# Patient Record
Sex: Female | Born: 1970 | Race: Black or African American | Hispanic: No | Marital: Married | Smoking: Never smoker
Health system: Southern US, Community
[De-identification: ages and names within clinical notes are randomized; demographics above are authoritative.]

## PROBLEM LIST (undated history)

## (undated) DIAGNOSIS — Z789 Other specified health status: Secondary | ICD-10-CM

## (undated) HISTORY — PX: NO PAST SURGERIES: SHX2092

---

## 2000-08-24 ENCOUNTER — Other Ambulatory Visit: Admission: RE | Admit: 2000-08-24 | Discharge: 2000-08-24 | Payer: Self-pay | Admitting: Gynecology

## 2000-10-07 ENCOUNTER — Encounter: Payer: Self-pay | Admitting: Gynecology

## 2000-10-07 ENCOUNTER — Inpatient Hospital Stay (HOSPITAL_COMMUNITY): Admission: AD | Admit: 2000-10-07 | Discharge: 2000-10-07 | Payer: Self-pay | Admitting: Gynecology

## 2001-03-01 ENCOUNTER — Inpatient Hospital Stay (HOSPITAL_COMMUNITY): Admission: AD | Admit: 2001-03-01 | Discharge: 2001-03-03 | Payer: Self-pay | Admitting: Gynecology

## 2001-03-13 ENCOUNTER — Encounter: Admission: RE | Admit: 2001-03-13 | Discharge: 2001-04-12 | Payer: Self-pay | Admitting: Gynecology

## 2001-04-08 ENCOUNTER — Other Ambulatory Visit: Admission: RE | Admit: 2001-04-08 | Discharge: 2001-04-08 | Payer: Self-pay | Admitting: Gynecology

## 2001-04-13 ENCOUNTER — Encounter: Admission: RE | Admit: 2001-04-13 | Discharge: 2001-05-13 | Payer: Self-pay | Admitting: Gynecology

## 2001-05-14 ENCOUNTER — Encounter: Admission: RE | Admit: 2001-05-14 | Discharge: 2001-06-13 | Payer: Self-pay | Admitting: Gynecology

## 2001-07-14 ENCOUNTER — Encounter: Admission: RE | Admit: 2001-07-14 | Discharge: 2001-08-13 | Payer: Self-pay | Admitting: Gynecology

## 2001-09-13 ENCOUNTER — Encounter: Admission: RE | Admit: 2001-09-13 | Discharge: 2001-10-13 | Payer: Self-pay | Admitting: Gynecology

## 2001-10-14 ENCOUNTER — Encounter: Admission: RE | Admit: 2001-10-14 | Discharge: 2001-11-13 | Payer: Self-pay | Admitting: Gynecology

## 2001-12-12 ENCOUNTER — Encounter: Admission: RE | Admit: 2001-12-12 | Discharge: 2002-01-11 | Payer: Self-pay | Admitting: Gynecology

## 2002-02-11 ENCOUNTER — Encounter: Admission: RE | Admit: 2002-02-11 | Discharge: 2002-03-13 | Payer: Self-pay | Admitting: Gynecology

## 2004-01-20 ENCOUNTER — Other Ambulatory Visit: Admission: RE | Admit: 2004-01-20 | Discharge: 2004-01-20 | Payer: Self-pay | Admitting: Obstetrics and Gynecology

## 2004-04-04 ENCOUNTER — Encounter: Admission: RE | Admit: 2004-04-04 | Discharge: 2004-04-04 | Payer: Self-pay | Admitting: Family Medicine

## 2006-01-16 ENCOUNTER — Other Ambulatory Visit: Admission: RE | Admit: 2006-01-16 | Discharge: 2006-01-16 | Payer: Self-pay | Admitting: Obstetrics and Gynecology

## 2006-07-21 ENCOUNTER — Inpatient Hospital Stay (HOSPITAL_COMMUNITY): Admission: AD | Admit: 2006-07-21 | Discharge: 2006-07-23 | Payer: Self-pay | Admitting: Obstetrics and Gynecology

## 2010-12-29 ENCOUNTER — Other Ambulatory Visit: Payer: Self-pay | Admitting: Family Medicine

## 2010-12-29 ENCOUNTER — Other Ambulatory Visit (HOSPITAL_COMMUNITY)
Admission: RE | Admit: 2010-12-29 | Discharge: 2010-12-29 | Disposition: A | Discharge status: Home / Self Care | Payer: BC Managed Care – PPO | Source: Ambulatory Visit | Attending: Internal Medicine | Admitting: Internal Medicine

## 2010-12-29 DIAGNOSIS — Z01419 Encounter for gynecological examination (general) (routine) without abnormal findings: Secondary | ICD-10-CM | POA: Insufficient documentation

## 2010-12-29 DIAGNOSIS — N6459 Other signs and symptoms in breast: Secondary | ICD-10-CM

## 2011-01-03 ENCOUNTER — Ambulatory Visit
Admission: RE | Admit: 2011-01-03 | Discharge: 2011-01-03 | Disposition: A | Discharge status: Home / Self Care | Payer: BC Managed Care – PPO | Source: Ambulatory Visit | Attending: Family Medicine | Admitting: Family Medicine

## 2011-01-03 ENCOUNTER — Other Ambulatory Visit: Payer: Self-pay | Admitting: Family Medicine

## 2011-01-03 DIAGNOSIS — N6459 Other signs and symptoms in breast: Secondary | ICD-10-CM

## 2011-09-27 ENCOUNTER — Other Ambulatory Visit: Payer: Self-pay | Admitting: Family Medicine

## 2011-09-27 DIAGNOSIS — N920 Excessive and frequent menstruation with regular cycle: Secondary | ICD-10-CM

## 2011-09-29 ENCOUNTER — Ambulatory Visit
Admission: RE | Admit: 2011-09-29 | Discharge: 2011-09-29 | Disposition: A | Discharge status: Home / Self Care | Payer: BC Managed Care – PPO | Source: Ambulatory Visit | Attending: Family Medicine | Admitting: Family Medicine

## 2011-09-29 DIAGNOSIS — N920 Excessive and frequent menstruation with regular cycle: Secondary | ICD-10-CM

## 2011-10-02 ENCOUNTER — Other Ambulatory Visit: Payer: BC Managed Care – PPO

## 2011-10-04 ENCOUNTER — Other Ambulatory Visit: Payer: BC Managed Care – PPO

## 2011-12-07 ENCOUNTER — Other Ambulatory Visit (HOSPITAL_COMMUNITY)
Admission: RE | Admit: 2011-12-07 | Discharge: 2011-12-07 | Disposition: A | Discharge status: Home / Self Care | Payer: BC Managed Care – PPO | Source: Ambulatory Visit | Attending: Obstetrics and Gynecology | Admitting: Obstetrics and Gynecology

## 2011-12-07 ENCOUNTER — Other Ambulatory Visit: Payer: Self-pay | Admitting: Obstetrics and Gynecology

## 2011-12-07 DIAGNOSIS — Z01419 Encounter for gynecological examination (general) (routine) without abnormal findings: Secondary | ICD-10-CM | POA: Insufficient documentation

## 2012-01-25 ENCOUNTER — Other Ambulatory Visit: Payer: Self-pay | Admitting: Family Medicine

## 2012-03-20 ENCOUNTER — Other Ambulatory Visit: Payer: Self-pay | Admitting: Family Medicine

## 2012-03-20 DIAGNOSIS — Z1231 Encounter for screening mammogram for malignant neoplasm of breast: Secondary | ICD-10-CM

## 2012-04-16 ENCOUNTER — Ambulatory Visit
Admission: RE | Admit: 2012-04-16 | Discharge: 2012-04-16 | Disposition: A | Discharge status: Home / Self Care | Payer: BC Managed Care – PPO | Source: Ambulatory Visit | Attending: Family Medicine | Admitting: Family Medicine

## 2012-04-16 DIAGNOSIS — Z1231 Encounter for screening mammogram for malignant neoplasm of breast: Secondary | ICD-10-CM

## 2013-03-31 ENCOUNTER — Other Ambulatory Visit: Payer: Self-pay

## 2013-03-31 DIAGNOSIS — Z1231 Encounter for screening mammogram for malignant neoplasm of breast: Secondary | ICD-10-CM

## 2013-04-22 ENCOUNTER — Ambulatory Visit: Payer: BC Managed Care – PPO

## 2013-07-22 ENCOUNTER — Ambulatory Visit
Admission: RE | Admit: 2013-07-22 | Discharge: 2013-07-22 | Disposition: A | Discharge status: Home / Self Care | Payer: BC Managed Care – PPO | Source: Ambulatory Visit

## 2013-07-22 ENCOUNTER — Ambulatory Visit: Payer: BC Managed Care – PPO

## 2013-07-22 DIAGNOSIS — Z1231 Encounter for screening mammogram for malignant neoplasm of breast: Secondary | ICD-10-CM

## 2013-10-03 ENCOUNTER — Other Ambulatory Visit: Payer: Self-pay

## 2013-10-03 ENCOUNTER — Other Ambulatory Visit (HOSPITAL_COMMUNITY)
Admission: RE | Admit: 2013-10-03 | Discharge: 2013-10-03 | Disposition: A | Discharge status: Home / Self Care | Payer: BC Managed Care – PPO | Source: Ambulatory Visit | Attending: Family Medicine | Admitting: Family Medicine

## 2013-10-03 DIAGNOSIS — Z124 Encounter for screening for malignant neoplasm of cervix: Secondary | ICD-10-CM | POA: Insufficient documentation

## 2014-08-25 ENCOUNTER — Other Ambulatory Visit: Payer: Self-pay | Admitting: Internal Medicine

## 2014-08-25 DIAGNOSIS — E049 Nontoxic goiter, unspecified: Secondary | ICD-10-CM

## 2014-09-02 ENCOUNTER — Ambulatory Visit
Admission: RE | Admit: 2014-09-02 | Discharge: 2014-09-02 | Disposition: A | Discharge status: Home / Self Care | Payer: BC Managed Care – PPO | Source: Ambulatory Visit | Attending: Internal Medicine | Admitting: Internal Medicine

## 2014-09-02 DIAGNOSIS — E049 Nontoxic goiter, unspecified: Secondary | ICD-10-CM

## 2014-10-15 ENCOUNTER — Other Ambulatory Visit: Payer: Self-pay | Admitting: Family Medicine

## 2014-10-15 DIAGNOSIS — E042 Nontoxic multinodular goiter: Secondary | ICD-10-CM

## 2014-10-30 ENCOUNTER — Ambulatory Visit
Admission: RE | Admit: 2014-10-30 | Discharge: 2014-10-30 | Disposition: A | Discharge status: Home / Self Care | Payer: BC Managed Care – PPO | Source: Ambulatory Visit | Attending: Family Medicine | Admitting: Family Medicine

## 2014-10-30 DIAGNOSIS — E042 Nontoxic multinodular goiter: Secondary | ICD-10-CM

## 2014-12-28 ENCOUNTER — Other Ambulatory Visit: Payer: Self-pay

## 2014-12-28 DIAGNOSIS — Z1231 Encounter for screening mammogram for malignant neoplasm of breast: Secondary | ICD-10-CM

## 2015-01-14 ENCOUNTER — Ambulatory Visit: Payer: BC Managed Care – PPO

## 2015-12-29 IMAGING — US US SOFT TISSUE HEAD/NECK
1 series · 13 of 25 positions shown · non-contrast
Comparison: Prior thyroid ultrasound 09/02/2014

CLINICAL DATA: 43-year-old female with multinodular goiter

EXAM:
THYROID ULTRASOUND
TECHNIQUE: Ultrasound examination of the thyroid gland and adjacent soft
tissues was performed.

[Series 1: us soft tissue head/neck · 0.05mm/px · 13 of 60 slices shown]
[im 1/60]
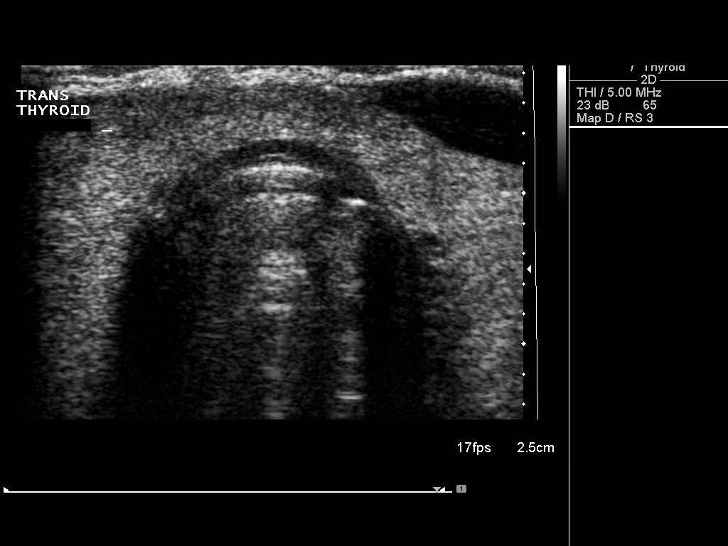
[im 5/60]
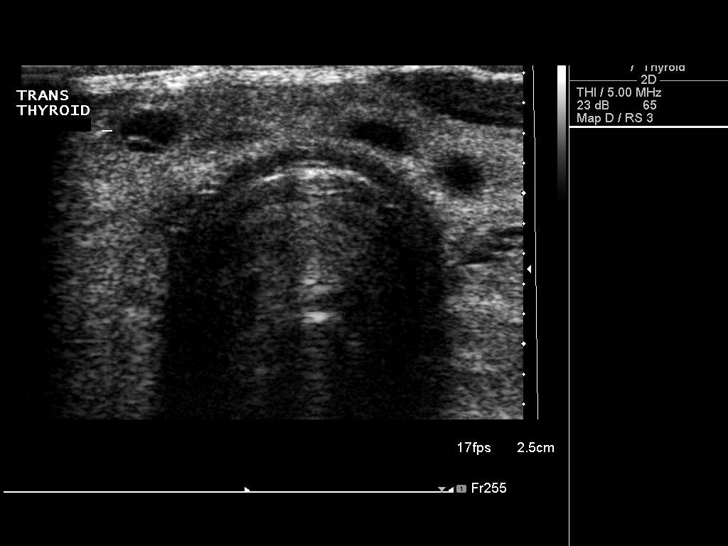
[im 10/60]
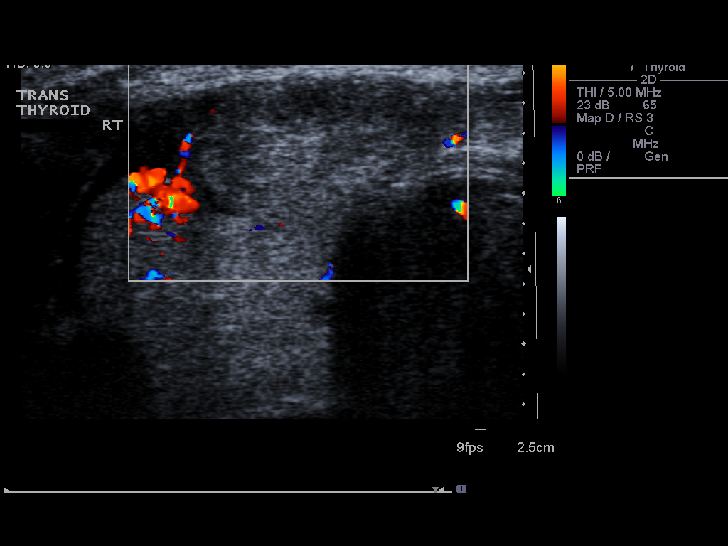
[im 15/60]
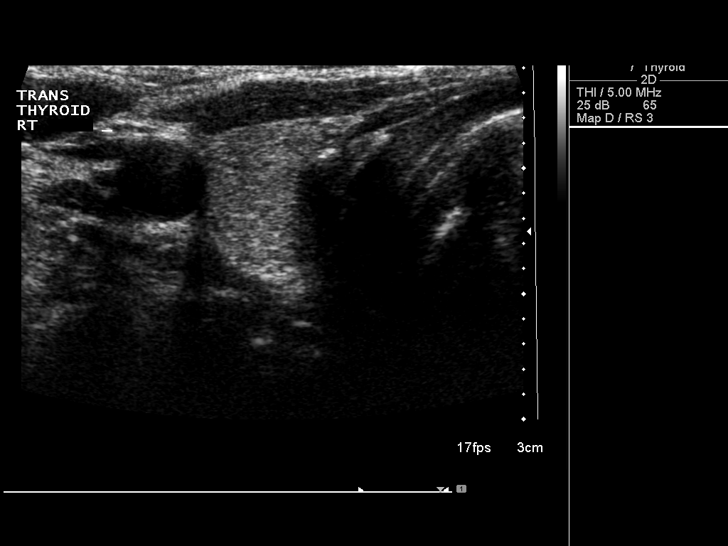
[im 20/60]
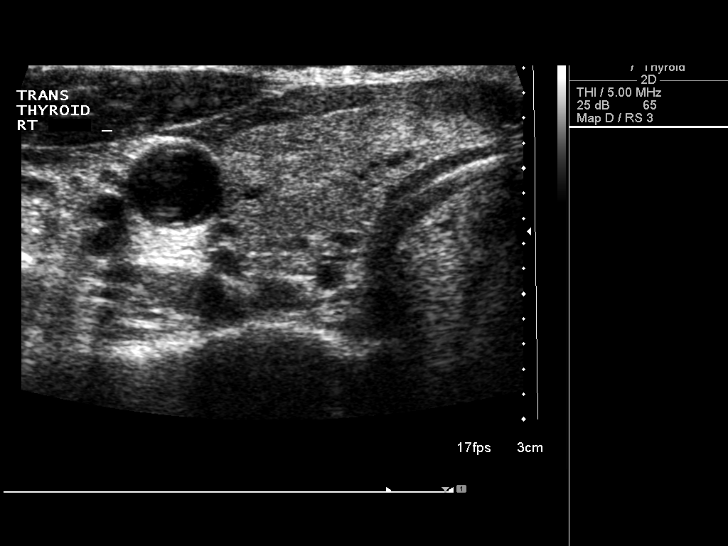
[im 25/60]
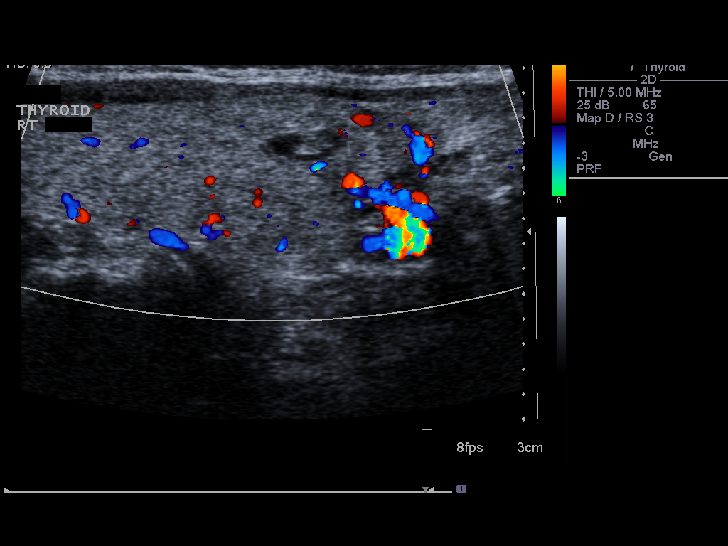
[im 30/60]
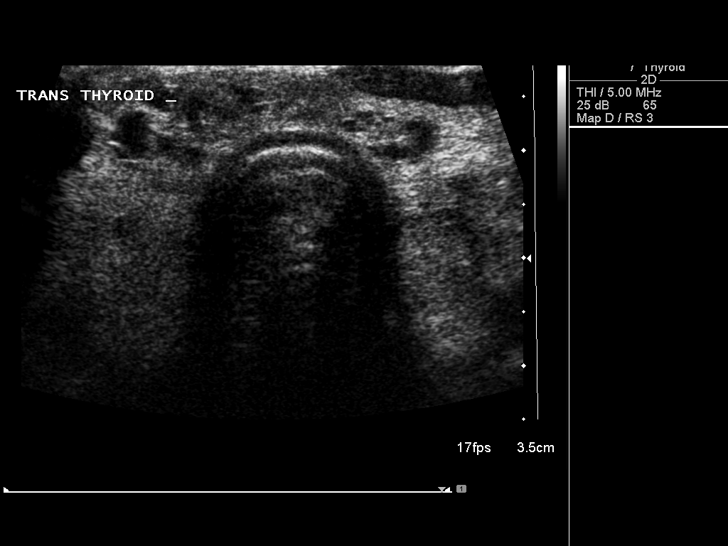
[im 35/60]
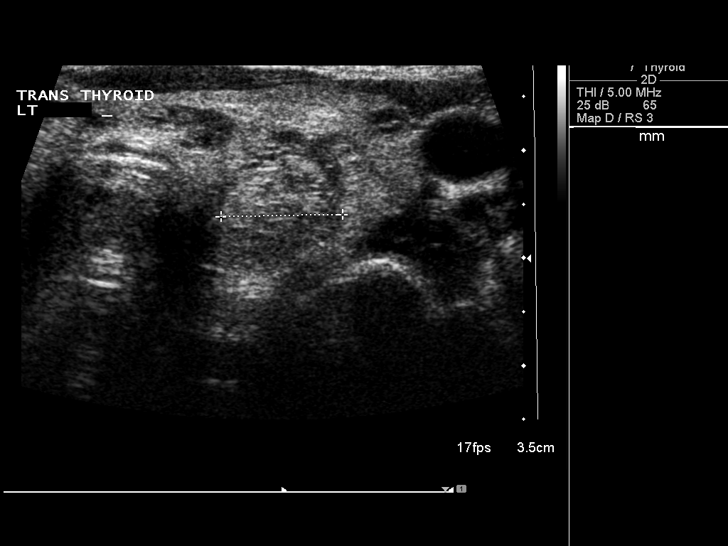
[im 40/60]
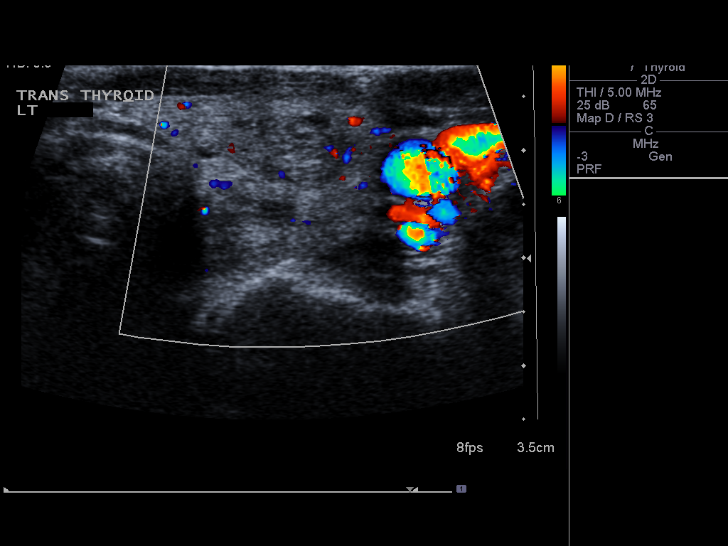
[im 45/60]
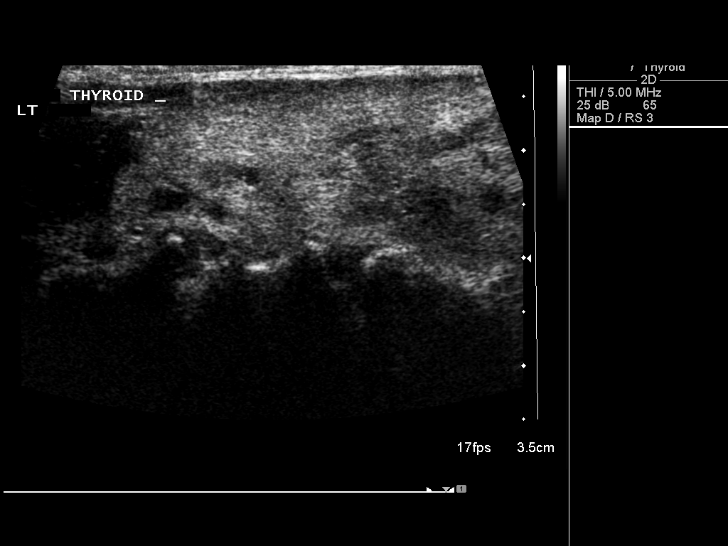
[im 50/60]
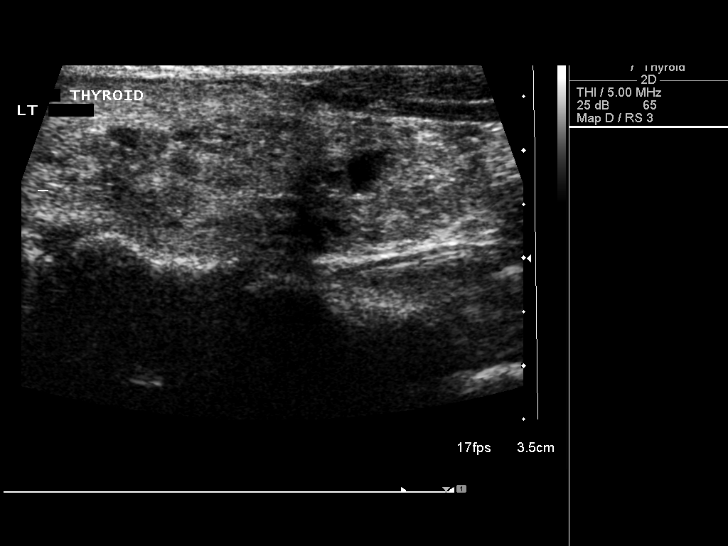
[im 55/60]
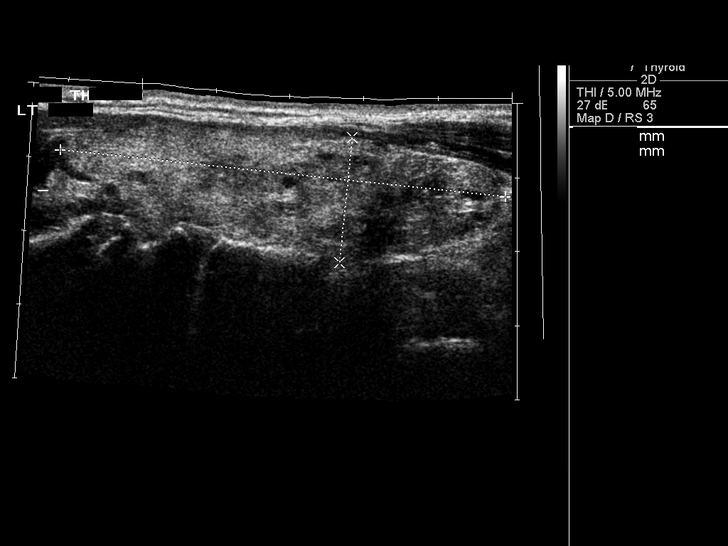
[im 60/60]
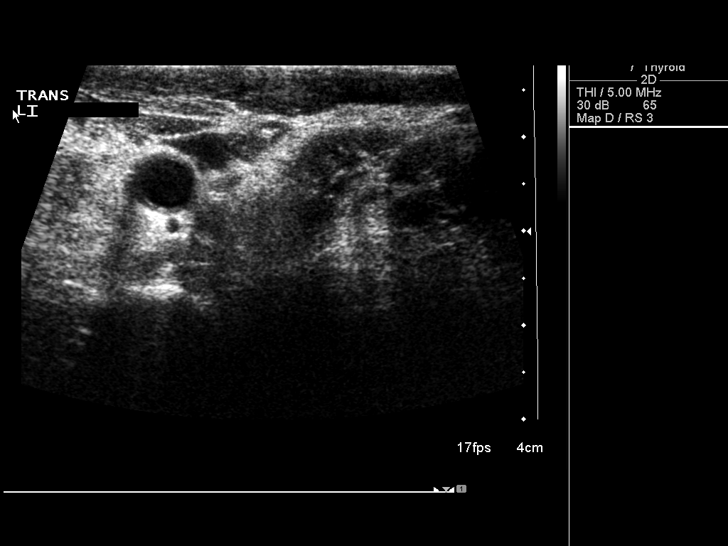

[13 of 25 positions shown; findings below may reference images not displayed]

FINDINGS: Right thyroid lobe

Measurements: 5.3 x 1.5 x 1.5 cm. Heterogeneous gland. Discrete
nodules as follows

1. Dominant solid isoechoic nodule in the medial mid gland abutting
the isthmus measures 13 x 9 x 15 mm which has decreased in size
compared to 17 x 10 x 21 mm previously.
2. Small mixed cystic and solid nodule in the lower pole measures up
to 6 mm which is unchanged compared to prior
Left thyroid lobe

Measurements: 6.0 x 1.7 x 1.8 cm. Heterogeneous gland with multiple
tiny cystic nodule scattered throughout similar compared to prior.
The solitary predominantly solid nodule in the mid gland remains
unchanged at 12 x 10 x 11 mm. The previously questioned subtle
nodule versus cluster of cysts in the lower pole appears less like a
discrete nodule today and more like a region of heterogeneity with
small internal cysts. No definite left lower pole nodule.

Isthmus

Thickness: 0.6 cm.  No nodules visualized.

Lymphadenopathy

None visualized.
IMPRESSION: 1. Interval involution of the dominant nodule in the right aspect of
the isthmus. Involution is consistent with benignity.
2. The previously noted questionable nodule versus cluster of small
cysts in the lower pole of the left thyroid lobe appears less
nodular like on today's evaluation and is strongly favored to
represent a pseudo nodule.
3. The remaining nodules demonstrate no interval change.

## 2016-03-09 ENCOUNTER — Other Ambulatory Visit: Payer: Self-pay | Admitting: Family Medicine

## 2016-03-09 DIAGNOSIS — Z1231 Encounter for screening mammogram for malignant neoplasm of breast: Secondary | ICD-10-CM

## 2016-03-15 ENCOUNTER — Ambulatory Visit
Admission: RE | Admit: 2016-03-15 | Discharge: 2016-03-15 | Disposition: A | Discharge status: Home / Self Care | Payer: BC Managed Care – PPO | Source: Ambulatory Visit | Attending: Family Medicine | Admitting: Family Medicine

## 2016-03-15 DIAGNOSIS — Z1231 Encounter for screening mammogram for malignant neoplasm of breast: Secondary | ICD-10-CM

## 2017-02-26 ENCOUNTER — Other Ambulatory Visit: Payer: Self-pay | Admitting: Family Medicine

## 2017-02-26 DIAGNOSIS — Z1231 Encounter for screening mammogram for malignant neoplasm of breast: Secondary | ICD-10-CM

## 2017-04-03 ENCOUNTER — Other Ambulatory Visit: Payer: Self-pay | Admitting: Family Medicine

## 2017-04-03 DIAGNOSIS — Z1231 Encounter for screening mammogram for malignant neoplasm of breast: Secondary | ICD-10-CM

## 2017-06-15 ENCOUNTER — Other Ambulatory Visit: Payer: Self-pay | Admitting: Family Medicine

## 2017-06-15 DIAGNOSIS — E010 Iodine-deficiency related diffuse (endemic) goiter: Secondary | ICD-10-CM

## 2017-06-19 ENCOUNTER — Other Ambulatory Visit: Payer: BC Managed Care – PPO

## 2017-06-25 ENCOUNTER — Ambulatory Visit
Admission: RE | Admit: 2017-06-25 | Discharge: 2017-06-25 | Disposition: A | Discharge status: Home / Self Care | Payer: BC Managed Care – PPO | Source: Ambulatory Visit | Attending: Family Medicine | Admitting: Family Medicine

## 2017-06-25 DIAGNOSIS — E010 Iodine-deficiency related diffuse (endemic) goiter: Secondary | ICD-10-CM

## 2018-01-30 ENCOUNTER — Other Ambulatory Visit: Payer: Self-pay | Admitting: Family Medicine

## 2018-01-30 DIAGNOSIS — Z1231 Encounter for screening mammogram for malignant neoplasm of breast: Secondary | ICD-10-CM

## 2018-02-20 ENCOUNTER — Ambulatory Visit
Admission: RE | Admit: 2018-02-20 | Discharge: 2018-02-20 | Disposition: A | Discharge status: Home / Self Care | Payer: BC Managed Care – PPO | Source: Ambulatory Visit | Attending: Family Medicine | Admitting: Family Medicine

## 2018-02-20 DIAGNOSIS — Z1231 Encounter for screening mammogram for malignant neoplasm of breast: Secondary | ICD-10-CM

## 2018-06-21 ENCOUNTER — Other Ambulatory Visit: Payer: Self-pay | Admitting: Family Medicine

## 2018-06-21 DIAGNOSIS — E010 Iodine-deficiency related diffuse (endemic) goiter: Secondary | ICD-10-CM

## 2018-07-08 ENCOUNTER — Other Ambulatory Visit: Payer: BC Managed Care – PPO

## 2018-07-12 ENCOUNTER — Ambulatory Visit
Admission: RE | Admit: 2018-07-12 | Discharge: 2018-07-12 | Disposition: A | Discharge status: Home / Self Care | Payer: BC Managed Care – PPO | Source: Ambulatory Visit | Attending: Family Medicine | Admitting: Family Medicine

## 2018-07-12 DIAGNOSIS — E010 Iodine-deficiency related diffuse (endemic) goiter: Secondary | ICD-10-CM

## 2018-08-07 ENCOUNTER — Other Ambulatory Visit: Payer: Self-pay | Admitting: Obstetrics and Gynecology

## 2018-08-07 ENCOUNTER — Other Ambulatory Visit (HOSPITAL_COMMUNITY)
Admission: RE | Admit: 2018-08-07 | Discharge: 2018-08-07 | Disposition: A | Discharge status: Home / Self Care | Payer: BC Managed Care – PPO | Source: Ambulatory Visit | Attending: Obstetrics and Gynecology | Admitting: Obstetrics and Gynecology

## 2018-08-07 DIAGNOSIS — Z01411 Encounter for gynecological examination (general) (routine) with abnormal findings: Secondary | ICD-10-CM | POA: Diagnosis not present

## 2018-08-12 LAB — CYTOLOGY - PAP
DIAGNOSIS: NEGATIVE
HPV (WINDOPATH): NOT DETECTED

## 2018-08-19 ENCOUNTER — Other Ambulatory Visit: Payer: Self-pay | Admitting: Family Medicine

## 2018-08-19 DIAGNOSIS — E042 Nontoxic multinodular goiter: Secondary | ICD-10-CM

## 2018-09-10 ENCOUNTER — Other Ambulatory Visit (HOSPITAL_COMMUNITY)
Admission: RE | Admit: 2018-09-10 | Discharge: 2018-09-10 | Disposition: A | Discharge status: Home / Self Care | Payer: BC Managed Care – PPO | Source: Ambulatory Visit | Attending: Physician Assistant | Admitting: Physician Assistant

## 2018-09-10 ENCOUNTER — Ambulatory Visit
Admission: RE | Admit: 2018-09-10 | Discharge: 2018-09-10 | Disposition: A | Discharge status: Home / Self Care | Payer: BC Managed Care – PPO | Source: Ambulatory Visit | Attending: Family Medicine | Admitting: Family Medicine

## 2018-09-10 DIAGNOSIS — E041 Nontoxic single thyroid nodule: Secondary | ICD-10-CM | POA: Diagnosis present

## 2018-09-10 DIAGNOSIS — E042 Nontoxic multinodular goiter: Secondary | ICD-10-CM

## 2018-09-10 NOTE — Procedures (Signed)
PROCEDURE SUMMARY:  Using direct ultrasound guidance, 4 passes were made using 25 g needles into the nodule within the isthmus of the thyroid.   Ultrasound was used to confirm needle placements on all occasions.   EBL = trace  Specimens were sent to Pathology for analysis.  See procedure note under Imaging tab in Epic for full procedure details.  WENDY S BLAIR PA-C 09/10/2018 4:19 PM

## 2019-05-26 ENCOUNTER — Other Ambulatory Visit: Payer: Self-pay | Admitting: Family Medicine

## 2019-05-26 DIAGNOSIS — Z1231 Encounter for screening mammogram for malignant neoplasm of breast: Secondary | ICD-10-CM

## 2019-07-11 ENCOUNTER — Encounter (HOSPITAL_COMMUNITY): Payer: Self-pay

## 2019-07-11 ENCOUNTER — Ambulatory Visit (HOSPITAL_COMMUNITY)
Admission: EM | Admit: 2019-07-11 | Discharge: 2019-07-11 | Disposition: A | Discharge status: Home / Self Care | Payer: BC Managed Care – PPO | Attending: Emergency Medicine | Admitting: Emergency Medicine

## 2019-07-11 DIAGNOSIS — U071 COVID-19: Secondary | ICD-10-CM | POA: Diagnosis not present

## 2019-07-11 DIAGNOSIS — B349 Viral infection, unspecified: Secondary | ICD-10-CM

## 2019-07-11 NOTE — ED Triage Notes (Signed)
Pt states having mild cough, chills, headache and body aches x 1 days. Pt reports having diarrhea x 1 day, but she states it can be relate to her cycle which started 2 days ago.

## 2019-07-11 NOTE — ED Provider Notes (Addendum)
[place]    CSN: [id number] Arrival date & time: 07/11/19  6644      History   Chief Complaint Chief Complaint  Patient presents with  . Chills  . Generalized Body Aches  . Cough  . Diarrhea  . Leg Pain  . Headache    HPI Kimberly Clements is a 48 y.o. female.   Patient presents with a 1 day history of nonproductive cough, chills, headache, body aches, diarrhea.  She denies fever, sore throat, shortness of breath, abdominal pain, vomiting, rash, or other symptoms.  No treatments attempted at home.  No pertinent medical history.  The history is provided by the patient.    History reviewed. No pertinent past medical history.  There are no active problems to display for this patient.   History reviewed. No pertinent surgical history.  OB History   No obstetric history on file.      Home Medications    Prior to Admission medications   Medication Sig Start Date End Date Taking? Authorizing Provider  Ascorbic Acid (VITAMIN C) 1000 MG tablet Take 1,000 mg by mouth daily.   Yes [provider]  VITAMIN D PO Take by mouth.   Yes [provider]  Iron-FA-B Cmp-C-Biot-Probiotic (FUSION PLUS) CAPS Take 1 capsule by mouth daily. 03/03/19   [provider]    Family History History reviewed. No pertinent family history.  Social History Social History   Tobacco Use  . Smoking status: Never Smoker  . Smokeless tobacco: Never Used  Substance Use Topics  . Alcohol use: Not on file  . Drug use: Not on file     Allergies   Patient has no known allergies.   Review of Systems Review of Systems  Constitutional: Positive for chills. Negative for fever.  HENT: Negative for ear pain and sore throat.   Eyes: Negative for pain and visual disturbance.  Respiratory: Positive for cough. Negative for shortness of breath.   Cardiovascular: Negative for chest pain and palpitations.  Gastrointestinal: Positive for diarrhea. Negative for  abdominal pain, nausea and vomiting.  Genitourinary: Negative for dysuria and hematuria.  Musculoskeletal: Negative for arthralgias and back pain.  Skin: Negative for color change and rash.  Neurological: Positive for headaches. Negative for seizures and syncope.  All other systems reviewed and are negative.    Physical Exam Triage Vital Signs ED Triage Vitals  Enc Vitals Group     BP 07/11/19 1007 (!) 127/92     Pulse Rate 07/11/19 1007 (!) 112     Resp 07/11/19 1007 17     Temp 07/11/19 1007 98.9 F (37.2 C)     Temp Source 07/11/19 1007 Temporal     SpO2 07/11/19 1007 100 %     Weight --      Height --      Head Circumference --      Peak Flow --      Pain Score 07/11/19 1003 5     Pain Loc --      Pain Edu? --      Excl. in [place]? --    No data found.  Updated Vital Signs BP (!) 127/92 (BP Location: Left Arm)   Pulse (!) 112   Temp 98.9 F (37.2 C) (Temporal)   Resp 17   LMP 07/09/2019 (Exact Date)   SpO2 100%   Visual Acuity Right Eye Distance:   Left Eye Distance:   Bilateral Distance:    Right Eye Near:  Left Eye Near:    Bilateral Near:     Physical Exam Vitals signs and nursing note reviewed.  Constitutional:      General: She is not in acute distress.    Appearance: She is well-developed.  HENT:     Head: Normocephalic and atraumatic.     Right Ear: Tympanic membrane normal.     Left Ear: Tympanic membrane normal.     Nose: Nose normal.     Mouth/Throat:     Mouth: Mucous membranes are moist.     Pharynx: Oropharynx is clear.  Eyes:     Conjunctiva/sclera: Conjunctivae normal.  Neck:     Musculoskeletal: Neck supple.  Cardiovascular:     Rate and Rhythm: Normal rate and regular rhythm.     Heart sounds: No murmur.  Pulmonary:     Effort: Pulmonary effort is normal. No respiratory distress.     Breath sounds: Normal breath sounds.  Abdominal:     General: Bowel sounds are normal.     Palpations: Abdomen is soft.     Tenderness: There  is no abdominal tenderness. There is no guarding or rebound.  Skin:    General: Skin is warm and dry.     Findings: No rash.  Neurological:     General: No focal deficit present.     Mental Status: She is alert and oriented to person, place, and time.      UC Treatments / Results  Labs (all labs ordered are listed, but only abnormal results are displayed) Labs Reviewed  NOVEL CORONAVIRUS, NAA (HOSP ORDER, SEND-OUT TO REF LAB; TAT 18-24 HRS)    EKG   Radiology No results found.  Procedures Procedures (including critical care time)  Medications Ordered in UC Medications - No data to display  Initial Impression / Assessment and Plan / UC Course  I have reviewed the triage vital signs and the nursing notes.  Pertinent labs & imaging results that were available during my care of the patient were reviewed by me and considered in my medical decision making (see chart for details).    Viral illness.  Instructed patient to take Tylenol as needed for discomfort or fever.  COVID test performed here.  Instructed patient to self quarantine until the test result is back.  Instructed patient to go to the emergency department if she develops high fever, shortness of breath, severe diarrhea, or other concerning symptoms.  Patient agrees with plan of care.     Final Clinical Impressions(s) / UC Diagnoses   Final diagnoses:  Viral illness     Discharge Instructions     Your COVID test is pending.  You should self quarantine until your test result is back and is negative.    Go to the emergency department if you develop high fever, shortness of breath, severe diarrhea, or other concerning symptoms.       ED Prescriptions    None     PDMP not reviewed this encounter.   Mickie Bail, NP 07/11/19 1030    Mickie Bail, NP 07/11/19 1030

## 2019-07-11 NOTE — Discharge Instructions (Addendum)
Your COVID test is pending.  You should self quarantine until your test result is back and is negative.   ° °Go to the emergency department if you develop high fever, shortness of breath, severe diarrhea, or other concerning symptoms.   ° °

## 2019-07-14 ENCOUNTER — Ambulatory Visit: Payer: BC Managed Care – PPO

## 2019-07-14 LAB — NOVEL CORONAVIRUS, NAA (HOSP ORDER, SEND-OUT TO REF LAB; TAT 18-24 HRS): SARS-CoV-2, NAA: DETECTED — AB

## 2019-07-15 ENCOUNTER — Telehealth (HOSPITAL_COMMUNITY): Payer: Self-pay | Admitting: Emergency Medicine

## 2019-07-15 NOTE — Telephone Encounter (Signed)
Positive Covid. Pt contacted and made aware, she had no questions.

## 2019-08-05 ENCOUNTER — Ambulatory Visit
Admission: RE | Admit: 2019-08-05 | Discharge: 2019-08-05 | Disposition: A | Discharge status: Home / Self Care | Payer: BC Managed Care – PPO | Source: Ambulatory Visit | Attending: Family Medicine | Admitting: Family Medicine

## 2019-08-05 ENCOUNTER — Other Ambulatory Visit: Payer: Self-pay | Admitting: Family Medicine

## 2019-08-05 DIAGNOSIS — R05 Cough: Secondary | ICD-10-CM

## 2019-08-05 DIAGNOSIS — R053 Chronic cough: Secondary | ICD-10-CM

## 2019-11-01 ENCOUNTER — Ambulatory Visit: Payer: BC Managed Care – PPO | Attending: Internal Medicine

## 2019-11-01 DIAGNOSIS — Z23 Encounter for immunization: Secondary | ICD-10-CM

## 2019-11-01 NOTE — Progress Notes (Signed)
   Covid-19 Vaccination Clinic  Name:  Kimberly Clements    MRN: [id number] DOB: 08-02-1971  11/01/2019  [name] was observed post Covid-19 immunization for 15 minutes without incidence. She was provided with Vaccine Information Sheet and instruction to access the V-Safe system.   [name] was instructed to call 911 with any severe reactions post vaccine: Marland Kitchen Difficulty breathing  . Swelling of your face and throat  . A fast heartbeat  . A bad rash all over your body  . Dizziness and weakness    Immunizations Administered    Name Date Dose VIS Date Route   Pfizer COVID-19 Vaccine 11/01/2019 12:15 PM 0.3 mL 08/15/2019 Intramuscular   Manufacturer: [employer], [employer]   Lot: [id number]   NDC: [address]-4

## 2019-11-22 ENCOUNTER — Ambulatory Visit: Payer: BC Managed Care – PPO | Attending: Internal Medicine

## 2019-11-22 DIAGNOSIS — Z23 Encounter for immunization: Secondary | ICD-10-CM

## 2019-11-22 NOTE — Progress Notes (Signed)
   Covid-19 Vaccination Clinic  Name:  ZOII FLORER    MRN: [id number] DOB: [birth year]  11/22/2019  Ms. Redwine was observed post Covid-19 immunization for 15 minutes without incident. She was provided with Vaccine Information Sheet and instruction to access the V-Safe system.   Ms. Ortman was instructed to call 911 with any severe reactions post vaccine: Marland Kitchen Difficulty breathing  . Swelling of face and throat  . A fast heartbeat  . A bad rash all over body  . Dizziness and weakness   Immunizations Administered    Name Date Dose VIS Date Route   Pfizer COVID-19 Vaccine 11/22/2019 12:15 PM 0.3 mL 08/15/2019 Intramuscular   Manufacturer: [employer], [employer]   Lot: [id number]   NDC: [address]-5

## 2019-11-26 ENCOUNTER — Ambulatory Visit: Payer: BC Managed Care – PPO

## 2020-03-30 ENCOUNTER — Ambulatory Visit (HOSPITAL_COMMUNITY)
Admission: EM | Admit: 2020-03-30 | Discharge: 2020-03-30 | Disposition: A | Discharge status: Home / Self Care | Payer: BC Managed Care – PPO

## 2020-03-30 ENCOUNTER — Other Ambulatory Visit: Payer: Self-pay

## 2020-03-30 ENCOUNTER — Ambulatory Visit (HOSPITAL_COMMUNITY)
Admission: EM | Admit: 2020-03-30 | Discharge: 2020-03-30 | Disposition: A | Discharge status: Home / Self Care | Payer: BC Managed Care – PPO | Attending: Family Medicine | Admitting: Family Medicine

## 2020-03-30 ENCOUNTER — Encounter (HOSPITAL_COMMUNITY): Payer: Self-pay | Admitting: Emergency Medicine

## 2020-03-30 DIAGNOSIS — Z20822 Contact with and (suspected) exposure to covid-19: Secondary | ICD-10-CM | POA: Insufficient documentation

## 2020-03-30 LAB — SARS CORONAVIRUS 2 (TAT 6-24 HRS): SARS Coronavirus 2: NEGATIVE

## 2020-03-30 NOTE — ED Triage Notes (Signed)
COVID exposure, aymptomatic

## 2020-03-30 NOTE — ED Provider Notes (Signed)
  [facility]   [id number] 03/30/20 Arrival Time: 1119  ASSESSMENT & PLAN:  1. Exposure to COVID-19 virus      COVID-19 testing sent.    Follow-up Information    Farris Has, MD.   Specialty: Family Medicine Why: As needed. Contact information: [address] [place] [address] [phone]               Reviewed expectations re: course of current medical issues. Questions answered. Outlined signs and symptoms indicating need for more acute intervention. Understanding verbalized. After Visit Summary given.   SUBJECTIVE: History from: patient. Kimberly Clements is a 49 y.o. female who requests COVID-19 testing. Known COVID-19 contact: reports exposure. Recent travel: none. Denies: runny nose, congestion, fever, cough, sore throat, difficulty breathing and headache. Normal PO intake without n/v/d.    OBJECTIVE:  Vitals:   03/30/20 1245  BP: (!) 131/84  Pulse: 88  Resp: 16  Temp: 98.7 F (37.1 C)  TempSrc: Oral  SpO2: 99%    General appearance: alert; no distress Lungs: speaks full sentences without difficulty; unlabored Psychological: alert and cooperative; normal mood and affect  Labs:  Labs Reviewed  SARS CORONAVIRUS 2 (TAT 6-24 HRS)    No Known Allergies  History reviewed. No pertinent past medical history. Social History   Socioeconomic History  . Marital status: Married    Spouse name: Not on file  . Number of children: Not on file  . Years of education: Not on file  . Highest education level: Not on file  Occupational History  . Not on file  Tobacco Use  . Smoking status: Never Smoker  . Smokeless tobacco: Never Used  Substance and Sexual Activity  . Alcohol use: Not on file  . Drug use: Not on file  . Sexual activity: Not on file  Other Topics Concern  . Not on file  Social History Narrative  . Not on file   Social Determinants of Health   Financial Resource Strain:   . Difficulty of Paying  Living Expenses:   Food Insecurity:   . Worried About Programme researcher, broadcasting/film/video in the Last Year:   . Barista in the Last Year:   Transportation Needs:   . Freight forwarder (Medical):   Marland Kitchen Lack of Transportation (Non-Medical):   Physical Activity:   . Days of Exercise per Week:   . Minutes of Exercise per Session:   Stress:   . Feeling of Stress :   Social Connections:   . Frequency of Communication with Friends and Family:   . Frequency of Social Gatherings with Friends and Family:   . Attends Religious Services:   . Active Member of Clubs or Organizations:   . Attends Banker Meetings:   Marland Kitchen Marital Status:   Intimate Partner Violence:   . Fear of Current or Ex-Partner:   . Emotionally Abused:   Marland Kitchen Physically Abused:   . Sexually Abused:    No family history on file. History reviewed. No pertinent surgical history.   Mardella Layman, MD 03/30/20 [phone]

## 2020-03-30 NOTE — Discharge Instructions (Signed)
You have been tested for COVID-19 today. °If your test returns positive, you will receive a phone call from [facility] regarding your results. °Negative test results are not called. °Both positive and negative results area always visible on MyChart. °If you do not have a MyChart account, sign up instructions are provided in your discharge papers. °Please do not hesitate to contact us should you have questions or concerns. ° °

## 2021-05-19 ENCOUNTER — Other Ambulatory Visit: Payer: Self-pay | Admitting: Family Medicine

## 2021-05-19 DIAGNOSIS — Z1231 Encounter for screening mammogram for malignant neoplasm of breast: Secondary | ICD-10-CM

## 2021-05-23 ENCOUNTER — Other Ambulatory Visit: Payer: Self-pay

## 2021-05-23 ENCOUNTER — Ambulatory Visit
Admission: RE | Admit: 2021-05-23 | Discharge: 2021-05-23 | Disposition: A | Discharge status: Home / Self Care | Payer: BC Managed Care – PPO | Source: Ambulatory Visit | Attending: Family Medicine | Admitting: Family Medicine

## 2021-05-23 DIAGNOSIS — Z1231 Encounter for screening mammogram for malignant neoplasm of breast: Secondary | ICD-10-CM

## 2021-05-30 ENCOUNTER — Other Ambulatory Visit: Payer: Self-pay | Admitting: Family Medicine

## 2021-05-30 DIAGNOSIS — R928 Other abnormal and inconclusive findings on diagnostic imaging of breast: Secondary | ICD-10-CM

## 2021-06-15 ENCOUNTER — Other Ambulatory Visit: Payer: Self-pay

## 2021-06-15 ENCOUNTER — Ambulatory Visit
Admission: RE | Admit: 2021-06-15 | Discharge: 2021-06-15 | Disposition: A | Discharge status: Home / Self Care | Payer: BC Managed Care – PPO | Source: Ambulatory Visit | Attending: Family Medicine | Admitting: Family Medicine

## 2021-06-15 ENCOUNTER — Ambulatory Visit: Payer: BC Managed Care – PPO

## 2021-06-15 DIAGNOSIS — R928 Other abnormal and inconclusive findings on diagnostic imaging of breast: Secondary | ICD-10-CM

## 2021-11-30 ENCOUNTER — Other Ambulatory Visit: Payer: Self-pay

## 2021-11-30 ENCOUNTER — Emergency Department (HOSPITAL_BASED_OUTPATIENT_CLINIC_OR_DEPARTMENT_OTHER)
Admission: EM | Admit: 2021-11-30 | Discharge: 2021-12-01 | Disposition: A | Discharge status: Home / Self Care | Payer: BC Managed Care – PPO | Attending: Emergency Medicine | Admitting: Emergency Medicine

## 2021-11-30 ENCOUNTER — Encounter (HOSPITAL_BASED_OUTPATIENT_CLINIC_OR_DEPARTMENT_OTHER): Payer: Self-pay

## 2021-11-30 DIAGNOSIS — G500 Trigeminal neuralgia: Secondary | ICD-10-CM | POA: Insufficient documentation

## 2021-11-30 DIAGNOSIS — R519 Headache, unspecified: Secondary | ICD-10-CM | POA: Diagnosis present

## 2021-11-30 NOTE — ED Triage Notes (Signed)
Patient here POV from Home. ? ?Patient endorses Facial Pain for approximately 3-4 days that has been worsening since it began. Pain is in Upper and Lower Gums (Left Side) and radiates to Back Gums and Cheek. ? ?Somewhat Responsive to Tylenol but Pain always remains. Patient went to Dentist today and Scans were Negative. ? ?No Fevers. No Drainage. No N/V/D.  ? ?NAD Noted during Triage. A&Ox4. GCS 15. Ambulatory.  ?

## 2021-12-01 ENCOUNTER — Encounter: Payer: Self-pay | Admitting: Neurology

## 2021-12-01 MED ORDER — CARBAMAZEPINE ER 100 MG PO TB12: 100.0000 mg | ORAL_TABLET | Freq: Two times a day (BID) | ORAL | 0 refills | Status: DC

## 2021-12-01 NOTE — ED Provider Notes (Signed)
? ?[facility]  ?Provider Note ? ?CSN: [id number] ?Arrival date & time: 11/30/21 2051 ? ?History ?Chief Complaint  ?Patient presents with  ? Facial Pain  ? ? ?Kimberly Clements is a 51 y.o. female with no significant PMH reports 4 days of intermittent sharp L sided facial pain, started L left jaw but has spread to cheek and L ear. No nasal congestion, sinus pressure, fever, cough. No blurry vision or eye watering. No headaches. No similar symptoms in the past. She went to a dentist and had neg exam and xrays. Not felt to be a dental problem. Has woken her up from sleep at night. ? ? ?Home Medications ?Prior to Admission medications   ?Medication Sig Start Date End Date Taking? Authorizing Provider  ?carbamazepine (TEGRETOL-XR) 100 MG 12 hr tablet Take 1 tablet (100 mg total) by mouth 2 (two) times daily. 12/01/21  Yes Pollyann Savoy, MD  ?Ascorbic Acid (VITAMIN C) 1000 MG tablet Take 1,000 mg by mouth daily.    [provider]  ?Iron-FA-B Cmp-C-Biot-Probiotic (FUSION PLUS) CAPS Take 1 capsule by mouth daily. 03/03/19   [provider]  ?VITAMIN D PO Take by mouth.    [provider]  ? ? ? ?Allergies    ?Patient has no known allergies. ? ? ?Review of Systems   ?Review of Systems ?Please see HPI for pertinent positives and negatives ? ?Physical Exam ?BP (!) 125/95   Pulse 85   Temp 98.1 ?F (36.7 ?C)   Resp 18   Ht 5\' 5"  (1.651 m)   Wt 61.2 kg   SpO2 99%   BMI 22.47 kg/m?  ? ?Physical Exam ?Vitals and nursing note reviewed.  ?Constitutional:   ?   Appearance: Normal appearance.  ?HENT:  ?   Head: Normocephalic and atraumatic.  ?   Right Ear: Tympanic membrane normal.  ?   Left Ear: Tympanic membrane normal.  ?   Nose: Nose normal. No congestion or rhinorrhea.  ?   Mouth/Throat:  ?   Mouth: Mucous membranes are moist.  ?   Pharynx: No posterior oropharyngeal erythema.  ?   Comments: No sinus tenderness, normal dentition, no clicking of TMJ ?Eyes:  ?    Extraocular Movements: Extraocular movements intact.  ?   Conjunctiva/sclera: Conjunctivae normal.  ?Cardiovascular:  ?   Rate and Rhythm: Normal rate.  ?Pulmonary:  ?   Effort: Pulmonary effort is normal.  ?   Breath sounds: Normal breath sounds.  ?Abdominal:  ?   General: Abdomen is flat.  ?   Palpations: Abdomen is soft.  ?   Tenderness: There is no abdominal tenderness.  ?Musculoskeletal:     ?   General: No swelling. Normal range of motion.  ?   Cervical back: Neck supple.  ?Skin: ?   General: Skin is warm and dry.  ?Neurological:  ?   General: No focal deficit present.  ?   Mental Status: She is alert.  ?Psychiatric:     ?   Mood and Affect: Mood normal.  ? ? ?ED Results / Procedures / Treatments   ?EKG ?None ? ?Procedures ?Procedures ? ?Medications Ordered in the ED ?Medications - No data to display ? ?Initial Impression and Plan ? Discussed possible causes of facial pain including sinusitis, TMJ, dental infection, etc. The most likely cause in her case is probably trigeminal neuralgia. We discussed the difficult nature of this disease with regards to diagnosis and treatment. She does not appear to  have any acute infectious or central neurologic cause. Will trial Tegretol, recommend close PCP follow up, referred to neurology as well.  ? ?ED Course  ? ?  ? ? ?MDM Rules/Calculators/A&P ?Medical Decision Making ?Problems Addressed: ?Trigeminal neuralgia of left side of face: acute illness or injury ? ?Risk ?Prescription drug management. ? ? ? ?Final Clinical Impression(s) / ED Diagnoses ?Final diagnoses:  ?Trigeminal neuralgia of left side of face  ? ? ?Rx / DC Orders ?ED Discharge Orders   ? ?      Ordered  ?  carbamazepine (TEGRETOL-XR) 100 MG 12 hr tablet  2 times daily       ? 12/01/21 0048  ?  Ambulatory referral to Neurology       ?Comments: An appointment is requested in approximately: 2 weeks  ? 12/01/21 0048  ? ?  ?  ? ?  ? ?  ?Pollyann Savoy, MD ?12/01/21 [phone] ? ?

## 2021-12-06 ENCOUNTER — Other Ambulatory Visit: Payer: Self-pay | Admitting: Family Medicine

## 2021-12-06 ENCOUNTER — Other Ambulatory Visit (HOSPITAL_COMMUNITY): Payer: Self-pay | Admitting: Family Medicine

## 2021-12-08 ENCOUNTER — Other Ambulatory Visit: Payer: Self-pay | Admitting: Family Medicine

## 2021-12-08 DIAGNOSIS — G500 Trigeminal neuralgia: Secondary | ICD-10-CM

## 2021-12-11 ENCOUNTER — Ambulatory Visit (HOSPITAL_COMMUNITY)
Admission: RE | Admit: 2021-12-11 | Discharge: 2021-12-11 | Disposition: A | Discharge status: Home / Self Care | Payer: BC Managed Care – PPO | Source: Ambulatory Visit | Attending: Family Medicine | Admitting: Family Medicine

## 2021-12-11 ENCOUNTER — Other Ambulatory Visit: Payer: Self-pay | Admitting: Family Medicine

## 2021-12-11 DIAGNOSIS — G500 Trigeminal neuralgia: Secondary | ICD-10-CM

## 2021-12-11 MED ORDER — GADOBUTROL 1 MMOL/ML IV SOLN
6.0000 mL | Freq: Once | INTRAVENOUS | Status: AC | PRN
  Administered 2021-12-11: 6 mL via INTRAVENOUS

## 2021-12-14 ENCOUNTER — Ambulatory Visit: Payer: BC Managed Care – PPO | Admitting: Psychiatry

## 2022-01-09 NOTE — Progress Notes (Signed)
? ?Referring:  ?Pollyann Savoy, MD ?[address] ?[place],  [place] [address] ? ?PCP: ?Shon Hale, MD ? ?Neurology was asked to evaluate Kimberly Clements for a chief complaint of headaches.  Our recommendations of care will be communicated by shared medical record.   ? ?CC:  facial pain ? ?History provided from self, husband ? ?HPI:  ?Medical co-morbidities: none ? ?The patient presents for evaluation of facial pain which began around November 26, 2021. First she felt tingling in her cheek, then it spread to her gums. Then she developed shocking electrical pains in her cheek and jaw. She first went to the dentist and was told her dental exam was normal.  ? ?Pain persisted so she presented to the ED 11/30/21 where she was started on carbamazepine 100 mg BID. This did help reduce her pain. However she then developed swelling around her jaw and throat. She was diagnosed with an infection. She is unclear what type of infection but per chart review appears to have been diagnosed with cellulitis of the jaw. She was told it was also in her lymph nodes. She stopped carbamazepine and started taking Bactrim. Her pain is not completely gone but has improved. She no longer has tenderness when touching her jaw. Still feels like it is swollen and her bite is not lining up like it used to. ? 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 ? ?FACIAL PAIN FEATURES  ?Side: left ?Distribution: V2, V3 ?Any pain on side or back of head: back of the head, tender to touch ?Character: electrical, shocking ?Duration: 3 minutes ?Pain-free between episodes: yes ?Triggers: chewing, no tactile triggers ?Sensory abnormalities: none ?Tried Tegretol/Trileptal: carbamazepine ?History of MS, Lyme's disease, facial rash: none ?History of dental/oral surgery, facial/plastic surgery: none ? ? ?Prior Therapies                                 ?Carbamazepine ?Advil ? ? ?LABS: ?none ? ?IMAGING:  ?MRI/MRA head  12/11/21: ?Normal parenchymal imaging.  No evidence of mass or stroke. ?  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 ? ?Imaging independently reviewed on Jan 10, 2022  ? ?Current Outpatient Medications on File Prior to Visit  ?Medication Sig Dispense Refill  ? Ascorbic Acid (VITAMIN C) 1000 MG tablet Take 1,000 mg by mouth daily.    ? carbamazepine (TEGRETOL-XR) 100 MG 12 hr tablet Take 1 tablet (100 mg total) by mouth 2 (two) times daily. 60 tablet 0  ? Iron-FA-B Cmp-C-Biot-Probiotic (FUSION PLUS) CAPS Take 1 capsule by mouth daily.    ? naproxen (NAPROSYN) 500 MG tablet SMARTSIG:1 Tablet(s) By Mouth Every 12 Hours PRN    ? sulfamethoxazole-trimethoprim (BACTRIM DS) 800-160 MG tablet 1 tablet    ? VITAMIN D PO Take by mouth.    ? ?No current facility-administered medications on file prior to visit.  ? ? ? ?Allergies: ?No Known Allergies ? ?Family History: ?Family History  ?Problem Relation Age of Onset  ? Diabetes Mother   ? High blood pressure Mother   ? High blood pressure Father   ? ? ? ?Past Medical History: ?No past medical history on file. ? ?Past Surgical History ?No past surgical history on file. ? ?Social History: ?Social History  ? ?Tobacco Use  ? Smoking status: Never  ? Smokeless tobacco:  Never  ?Substance Use Topics  ? Alcohol use: Never  ? Drug use: Never  ? ? ?ROS: ?Negative for fevers, chills. Positive for facial pain. All other systems reviewed and negative unless stated otherwise in HPI. ? ? ?Physical Exam:  ? ?Vital Signs: ?BP 122/76   Pulse 67   Ht 5\' 5"  (1.651 m)   Wt 129 lb (58.5 kg)   SpO2 92%   BMI 21.47 kg/m?  ?GENERAL: well appearing,in no acute distress,alert ?SKIN:  Color, texture, turgor normal. No rashes or lesions ?HEAD:  Normocephalic/atraumatic. ?CV:  RRR ?RESP: Normal respiratory effort ?MSK: no tenderness to palpation over occiput, neck, or shoulders ? ?NEUROLOGICAL: ?Mental Status: Alert, oriented to person, place  and time,Follows commands ?Cranial Nerves: PERRL, visual fields intact to confrontation, extraocular movements intact, facial sensation intact, no facial droop or ptosis, hearing grossly intact, no dysarthria ?Motor: muscle strength 5/5 both upper and lower extremities ?Reflexes: 2+ throughout ?Sensation: intact to light touch all 4 extremities ?Coordination: Finger-to- nose-finger intact bilaterally ?Gait: normal-based ? ? ?IMPRESSION: ?51 year old female without significant medical history who presents for evaluation of left sided facial pain which began in March 2023. While her MRI/MRA does show a SCA loop contacting the left trigeminal nerve, it is atypical to have swelling associated with trigeminal neuralgia and for symptoms to improve with antibiotics. Suspect her symptoms are secondary to her recently discovered jaw infection. Will have her complete course of antibiotics and see if symptoms continue to improve. If she continues to have pain after completing antibiotics and there is no sign of infection, may consider treating for trigeminal neuralgia at that time. ? ?PLAN: ?-Continue antibiotic course, hold off on restarting carbamazepine for now ?-May consider treating for TN if pain persists after she completes antibiotic course ? ? ?I spent a total of 33 minutes chart reviewing and counseling the patient. Education was done. Discussed treatment options including preventive  medications. Discussed medication side effects, adverse reactions and drug interactions. Written educational materials and patient instructions outlining all of the above were given. ? ?Follow-up: if symptoms persist/fail to improve ? ? ?Genia Harold, MD ?01/10/2022   ?12:09 PM ? ? ?

## 2022-01-10 ENCOUNTER — Encounter: Payer: Self-pay | Admitting: Psychiatry

## 2022-01-10 ENCOUNTER — Ambulatory Visit: Payer: BC Managed Care – PPO | Admitting: Psychiatry

## 2022-01-10 VITALS — BP 122/76 | HR 67 | Ht 65.0 in | Wt 129.0 lb

## 2022-01-10 DIAGNOSIS — R519 Headache, unspecified: Secondary | ICD-10-CM

## 2022-02-13 ENCOUNTER — Ambulatory Visit (INDEPENDENT_AMBULATORY_CARE_PROVIDER_SITE_OTHER): Payer: BC Managed Care – PPO | Admitting: Psychiatry

## 2022-02-13 ENCOUNTER — Encounter: Payer: Self-pay | Admitting: Psychiatry

## 2022-02-13 VITALS — BP 116/82 | HR 78 | Ht 63.0 in | Wt 131.0 lb

## 2022-02-13 DIAGNOSIS — Z5181 Encounter for therapeutic drug level monitoring: Secondary | ICD-10-CM

## 2022-02-13 DIAGNOSIS — R519 Headache, unspecified: Secondary | ICD-10-CM

## 2022-02-13 MED ORDER — OXCARBAZEPINE 150 MG PO TABS: 150.0000 mg | ORAL_TABLET | Freq: Two times a day (BID) | ORAL | 6 refills | Status: DC

## 2022-02-13 MED ORDER — BACLOFEN 10 MG PO TABS: 10.0000 mg | ORAL_TABLET | Freq: Three times a day (TID) | ORAL | 6 refills | Status: DC | PRN

## 2022-02-13 NOTE — Progress Notes (Signed)
   CC:  facial pain  Follow-up Visit  Last visit: 01/10/22  Brief HPI: 51 year old female who follows in clinic for facial pain which began around March 2023. 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  At her last visit she had started antibiotics for a jaw infection. Carbamazepine was held to see if pain would improve with antibiotics.  Interval History: She continued to have pain on the left side of her face after completing the antibiotic course.  Sharp pains have improved but she has a dull aching which is worse with chewing. Pain is primarily in her left jaw and two of her left upper front teeth. Has some pain in the ear as well. Did not restart carbamazepine because it made her feel loopy and caused paresthesias in her face. Continues to feel her jaw is swollen and bite is out of alignment. States her previous dental exam was unremarkable, but does not believe she had swelling at the time of that visit.   Prior Therapies                                  Carbamazepine - made her feel loopy Advil  Physical Exam:   Vital Signs: BP 116/82   Pulse 78   Ht 5\' 3"  (1.6 m)   Wt 131 lb (59.4 kg)   BMI 23.21 kg/m  GENERAL:  well appearing, in no acute distress, alert  SKIN:  Color, texture, turgor normal. No rashes or lesions HEAD:  Normocephalic/atraumatic. RESP: normal respiratory effort MSK:  No gross joint deformities.   NEUROLOGICAL: Mental Status: Alert, oriented to person, place and time, Follows commands, and Speech fluent and appropriate. Cranial Nerves: PERRL, face symmetric, no dysarthria, hearing grossly intact Motor: moves all extremities equally Gait: normal-based.  IMPRESSION: 51 year old female who presents for follow up of facial pain. She has had some improvement with antibiotics but continues to have dull aching in her left jaw. Presentation is atypical for trigeminal neuralgia, however she does have a left vascular loop on MRA.  Will have her set up a follow up dentist appointment to ensure there is no dental cause for her symptoms as she does report swelling and bite malalignment. In the meantime will start oxcarbazepine for pain as it is currently interfering with her ability to chew. Will start baclofen for breakthrough pain.  PLAN: -CBC, CMP -Start oxcarbazepine 150 mg BID  -Start baclofen 10 mg TID PRN for breakthrough pain -She will set up a dentist appointment to assess for dental causes of her symptoms   Follow-up: 5 months  I spent a total of 26 minutes on the date of the service. Discussed treatment options including preventive and acute medications. Discussed medication side effects, adverse reactions and drug interactions. Written educational materials and patient instructions outlining all of the above were given.  Genia Harold, MD 02/13/22 11:32 AM

## 2022-02-13 NOTE — Patient Instructions (Addendum)
Start oxcarbazepine twice a day to help prevent facial pain Take baclofen up to 3 times a day as needed for facial pain Blood work today Please set up a dentist appointment for to evaluate your jaw swelling and bite. Please have dentist fax over their notes from that visit

## 2022-02-14 LAB — COMPREHENSIVE METABOLIC PANEL
ALT: 12 IU/L (ref 0–32)
AST: 16 IU/L (ref 0–40)
Albumin/Globulin Ratio: 1.6 (ref 1.2–2.2)
Albumin: 4.1 g/dL (ref 3.8–4.8)
Alkaline Phosphatase: 90 IU/L (ref 44–121)
BUN/Creatinine Ratio: 13 (ref 9–23)
BUN: 10 mg/dL (ref 6–24)
Bilirubin Total: 0.5 mg/dL (ref 0.0–1.2)
CO2: 24 mmol/L (ref 20–29)
Calcium: 9.1 mg/dL (ref 8.7–10.2)
Chloride: 104 mmol/L (ref 96–106)
Creatinine, Ser: 0.79 mg/dL (ref 0.57–1.00)
Globulin, Total: 2.5 g/dL (ref 1.5–4.5)
Glucose: 63 mg/dL — ABNORMAL LOW (ref 70–99)
Potassium: 4.4 mmol/L (ref 3.5–5.2)
Sodium: 141 mmol/L (ref 134–144)
Total Protein: 6.6 g/dL (ref 6.0–8.5)
eGFR: 91 mL/min/{1.73_m2} (ref >59)

## 2022-02-14 LAB — CBC WITH DIFFERENTIAL/PLATELET
Basophils Absolute: 0 10*3/uL (ref 0.0–0.2)
Basos: 1 %
EOS (ABSOLUTE): 0.1 10*3/uL (ref 0.0–0.4)
Eos: 3 %
Hematocrit: 37 % (ref 34.0–46.6)
Hemoglobin: 12.3 g/dL (ref 11.1–15.9)
Immature Grans (Abs): 0 10*3/uL (ref 0.0–0.1)
Immature Granulocytes: 0 %
Lymphocytes Absolute: 1.5 10*3/uL (ref 0.7–3.1)
Lymphs: 41 %
MCH: 29.2 pg (ref 26.6–33.0)
MCHC: 33.2 g/dL (ref 31.5–35.7)
MCV: 88 fL (ref 79–97)
Monocytes Absolute: 0.4 10*3/uL (ref 0.1–0.9)
Monocytes: 10 %
Neutrophils Absolute: 1.7 10*3/uL (ref 1.4–7.0)
Neutrophils: 45 %
Platelets: 236 10*3/uL (ref 150–450)
RBC: 4.21 x10E6/uL (ref 3.77–5.28)
RDW: 14.2 % (ref 11.7–15.4)
WBC: 3.7 10*3/uL (ref 3.4–10.8)

## 2022-04-03 NOTE — Progress Notes (Deleted)
   NEUROLOGY CONSULTATION NOTE  Kimberly Clements MRN: [id number] DOB: 12-16-1970  Referring provider: Anselm Lis, DMD Primary care provider: Shon Hale, MD  Reason for consult:  trigeminal neuralgia  Assessment/Plan:   ***   Subjective:  Kimberly Clements is a 51 year old female who presents for trigeminal neuralgia.  History supplemented by referring provider's note.       PAST MEDICAL HISTORY: No past medical history on file.  PAST SURGICAL HISTORY: No past surgical history on file.  MEDICATIONS: Current Outpatient Medications on File Prior to Visit  Medication Sig Dispense Refill   Ascorbic Acid (VITAMIN C) 1000 MG tablet Take 1,000 mg by mouth daily.     baclofen (LIORESAL) 10 MG tablet Take 1 tablet (10 mg total) by mouth 3 (three) times daily as needed for muscle spasms. 30 each 6   Iron-FA-B Cmp-C-Biot-Probiotic (FUSION PLUS) CAPS Take 1 capsule by mouth daily.     naproxen (NAPROSYN) 500 MG tablet SMARTSIG:1 Tablet(s) By Mouth Every 12 Hours PRN     OXcarbazepine (TRILEPTAL) 150 MG tablet Take 1 tablet (150 mg total) by mouth 2 (two) times daily. 60 tablet 6   sulfamethoxazole-trimethoprim (BACTRIM DS) 800-160 MG tablet 1 tablet     VITAMIN D PO Take by mouth.     No current facility-administered medications on file prior to visit.    ALLERGIES: No Known Allergies  FAMILY HISTORY: Family History  Problem Relation Age of Onset   Diabetes Mother    High blood pressure Mother    High blood pressure Father     Objective:  *** General: No acute distress.  Patient appears well-groomed.   Head:  Normocephalic/atraumatic Eyes:  fundi examined but not visualized Neck: supple, no paraspinal tenderness, full range of motion Back: No paraspinal tenderness Heart: regular rate and rhythm Lungs: Clear to auscultation bilaterally. Vascular: No carotid bruits. Neurological Exam: Mental status: alert and oriented to person, place, and time, speech fluent  and not dysarthric, language intact. Cranial nerves: CN I: not tested CN II: pupils equal, round and reactive to light, visual fields intact CN III, IV, VI:  full range of motion, no nystagmus, no ptosis CN V: facial sensation intact. CN VII: upper and lower face symmetric CN VIII: hearing intact CN IX, X: gag intact, uvula midline CN XI: sternocleidomastoid and trapezius muscles intact CN XII: tongue midline Bulk & Tone: normal, no fasciculations. Motor:  muscle strength 5/5 throughout Sensation:  Pinprick, temperature and vibratory sensation intact. Deep Tendon Reflexes:  2+ throughout,  toes downgoing.   Finger to nose testing:  Without dysmetria.   Heel to shin:  Without dysmetria.   Gait:  Normal station and stride.  Romberg negative.    Thank you for allowing me to take part in the care of this patient.  Shon Millet, DO  CC: ***

## 2022-04-04 ENCOUNTER — Ambulatory Visit: Payer: BC Managed Care – PPO | Admitting: Neurology

## 2022-06-21 ENCOUNTER — Ambulatory Visit: Payer: BC Managed Care – PPO | Admitting: Psychiatry

## 2022-08-11 ENCOUNTER — Other Ambulatory Visit: Payer: Self-pay | Admitting: Family Medicine

## 2022-08-11 DIAGNOSIS — Z1231 Encounter for screening mammogram for malignant neoplasm of breast: Secondary | ICD-10-CM

## 2022-08-14 NOTE — Progress Notes (Unsigned)
   CC: facial pain  Follow-up Visit  Last visit: 02/13/22  Brief HPI: 51 year old female who follows in clinic for left facial pain which began around March 2023. 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   At her last visit she was started on oxcarbazepine and baclofen. Interval History: ***   Headache days per month: *** Migraine days per month*** Headache free days per month: ***  Current Headache Regimen: Preventative: *** Abortive: ***   Prior Therapies                                  Carbamazepine - made her feel loopy Advil  Physical Exam:   Vital Signs: There were no vitals taken for this visit. GENERAL:  well appearing, in no acute distress, alert  SKIN:  Color, texture, turgor normal. No rashes or lesions HEAD:  Normocephalic/atraumatic. RESP: normal respiratory effort MSK:  No gross joint deformities.   NEUROLOGICAL: Mental Status: Alert, oriented to person, place and time, Follows commands, and Speech fluent and appropriate. Cranial Nerves: PERRL, face symmetric, no dysarthria, hearing grossly intact Motor: moves all extremities equally Gait: normal-based.  IMPRESSION: ***  PLAN: ***   Follow-up: ***  I spent a total of *** minutes on the date of the service. Headache education was done. Discussed lifestyle modification including increased oral hydration, decreased caffeine, exercise and stress management. Discussed treatment options including preventive and acute medications, natural supplements, and infusion therapy. Discussed medication overuse headache and to limit use of acute treatments to no more than 2 days/week or 10 days/month. Discussed medication side effects, adverse reactions and drug interactions. Written educational materials and patient instructions outlining all of the above were given.  Ocie Doyne, MD

## 2022-08-15 ENCOUNTER — Ambulatory Visit: Payer: BC Managed Care – PPO | Admitting: Psychiatry

## 2022-08-15 ENCOUNTER — Encounter: Payer: Self-pay | Admitting: Psychiatry

## 2022-08-15 VITALS — BP 118/85 | HR 88 | Ht 64.0 in | Wt 138.2 lb

## 2022-08-15 DIAGNOSIS — R519 Headache, unspecified: Secondary | ICD-10-CM | POA: Diagnosis not present

## 2022-09-14 ENCOUNTER — Other Ambulatory Visit: Payer: Self-pay | Admitting: Otolaryngology

## 2022-10-02 NOTE — Pre-Procedure Instructions (Signed)
Surgical Instructions    Your procedure is scheduled on Friday, February 2nd.  Report to [facility] Main Entrance "A" at 06:45 A.M., then check in with the Admitting office.  Call this number if you have problems the morning of surgery:  [phone]  If you have any questions prior to your surgery date call [phone]: Open Monday-Friday 8am-4pm If you experience any cold or flu symptoms such as cough, fever, chills, shortness of breath, etc. between now and your scheduled surgery, please notify us at the above number.     Remember:  Do not eat after midnight the night before your surgery  You may drink clear liquids until 05:45 AM the morning of your surgery.   Clear liquids allowed are: Water, Non-Citrus Juices (without pulp), Carbonated Beverages, Clear Tea, Black Coffee Only (NO MILK, CREAM OR POWDERED CREAMER of any kind), and Gatorade.    Take these medicines the morning of surgery with A SIP OF WATER: none    As of today, STOP taking any Aspirin (unless otherwise instructed by your surgeon) Aleve, Naproxen, Ibuprofen, Motrin, Advil, Goody's, BC's, all herbal medications, fish oil, and all vitamins.                     Do NOT Smoke (Tobacco/Vaping) for 24 hours prior to your procedure.  If you use a CPAP at night, you may bring your mask/headgear for your overnight stay.   Contacts, glasses, piercing's, hearing aid's, dentures or partials may not be worn into surgery, please bring cases for these belongings.    For patients admitted to the hospital, discharge time will be determined by your treatment team.   Patients discharged the day of surgery will not be allowed to drive home, and someone needs to stay with them for 24 hours.  SURGICAL WAITING ROOM VISITATION Patients having surgery or a procedure may have no more than 2 support people in the waiting area - these visitors may rotate.   Children under the age of 69 must have an adult with them who is not the  patient. If the patient needs to stay at the hospital during part of their recovery, the visitor guidelines for inpatient rooms apply. Pre-op nurse will coordinate an appropriate time for 1 support person to accompany patient in pre-op.  This support person may not rotate.   Please refer to the [facility] website for the visitor guidelines for Inpatients (after your surgery is over and you are in a regular room).    Special instructions:   [facility]- Preparing For Surgery  Before surgery, you can play an important role. Because skin is not sterile, your skin needs to be as free of germs as possible. You can reduce the number of germs on your skin by washing with CHG (chlorahexidine gluconate) Soap before surgery.  CHG is an antiseptic cleaner which kills germs and bonds with the skin to continue killing germs even after washing.    Oral Hygiene is also important to reduce your risk of infection.  Remember - BRUSH YOUR TEETH THE MORNING OF SURGERY WITH YOUR REGULAR TOOTHPASTE  Please do not use if you have an allergy to CHG or antibacterial soaps. If your skin becomes reddened/irritated stop using the CHG.  Do not shave (including legs and underarms) for at least 48 hours prior to first CHG shower. It is OK to shave your face.  Please follow these instructions carefully.   Shower the NIGHT BEFORE SURGERY and the MORNING OF SURGERY  If you chose to wash your hair, wash your hair first as usual with your normal shampoo.  After you shampoo, rinse your hair and body thoroughly to remove the shampoo.  Use CHG Soap as you would any other liquid soap. You can apply CHG directly to the skin and wash gently with a scrungie or a clean washcloth.   Apply the CHG Soap to your body ONLY FROM THE NECK DOWN.  Do not use on open wounds or open sores. Avoid contact with your eyes, ears, mouth and genitals (private parts). Wash Face and genitals (private parts)  with your normal soap.   Wash thoroughly,  paying special attention to the area where your surgery will be performed.  Thoroughly rinse your body with warm water from the neck down.  DO NOT shower/wash with your normal soap after using and rinsing off the CHG Soap.  Pat yourself dry with a CLEAN TOWEL.  Wear CLEAN PAJAMAS to bed the night before surgery  Place CLEAN SHEETS on your bed the night before your surgery  DO NOT SLEEP WITH PETS.   Day of Surgery: Take a shower with CHG soap. Do not wear jewelry or makeup Do not wear lotions, powders, perfumes, or deodorant. Do not shave 48 hours prior to surgery.   Do not bring valuables to the hospital. [facility] is not responsible for any belongings or valuables. Do not wear nail polish, gel polish, artificial nails, or any other type of covering on natural nails (fingers and toes) If you have artificial nails or gel coating that need to be removed by a nail salon, please have this removed prior to surgery. Artificial nails or gel coating may interfere with anesthesia's ability to adequately monitor your vital signs. Wear Clean/Comfortable clothing the morning of surgery Remember to brush your teeth WITH YOUR REGULAR TOOTHPASTE.   Please read over the following fact sheets that you were given.    If you received a COVID test during your pre-op visit  it is requested that you wear a mask when out in public, stay away from anyone that may not be feeling well and notify your surgeon if you develop symptoms. If you have been in contact with anyone that has tested positive in the last 10 days please notify you surgeon.

## 2022-10-03 ENCOUNTER — Encounter (HOSPITAL_COMMUNITY)
Admission: RE | Admit: 2022-10-03 | Discharge: 2022-10-03 | Disposition: A | Discharge status: Home / Self Care | Payer: BC Managed Care – PPO | Source: Ambulatory Visit | Attending: Otolaryngology | Admitting: Otolaryngology

## 2022-10-03 ENCOUNTER — Other Ambulatory Visit: Payer: Self-pay

## 2022-10-03 ENCOUNTER — Encounter (HOSPITAL_COMMUNITY): Payer: Self-pay

## 2022-10-03 VITALS — BP 137/88 | HR 97 | Temp 97.8°F | Resp 18 | Ht 65.0 in | Wt 137.6 lb

## 2022-10-03 DIAGNOSIS — Z01812 Encounter for preprocedural laboratory examination: Secondary | ICD-10-CM | POA: Insufficient documentation

## 2022-10-03 DIAGNOSIS — Z01818 Encounter for other preprocedural examination: Secondary | ICD-10-CM

## 2022-10-03 HISTORY — DX: Other specified health status: Z78.9

## 2022-10-03 LAB — CBC
HCT: 41.7 % (ref 36.0–46.0)
Hemoglobin: 14.2 g/dL (ref 12.0–15.0)
MCH: 32 pg (ref 26.0–34.0)
MCHC: 34.1 g/dL (ref 30.0–36.0)
MCV: 93.9 fL (ref 80.0–100.0)
Platelets: 249 10*3/uL (ref 150–400)
RBC: 4.44 MIL/uL (ref 3.87–5.11)
RDW: 12.5 % (ref 11.5–15.5)
WBC: 5.1 10*3/uL (ref 4.0–10.5)
nRBC: 0 % (ref 0.0–0.2)

## 2022-10-03 NOTE — Progress Notes (Signed)
PCP - Jonathon Jordan MD Cardiologist - denies  PPM/ICD - denies Device Orders -  Rep Notified -   Chest x-ray - na EKG - na Stress Test - denies ECHO - denies Cardiac Cath - denies  Sleep Study - denies CPAP -   Fasting Blood Sugar - na Checks Blood Sugar _____ times a day  Last dose of GLP1 agonist-  na GLP1 instructions:   Blood Thinner Instructions:na Aspirin Instructions:na  ERAS Protcol -clear liquids until 0545 PRE-SURGERY Ensure or G2- no  COVID TEST- na   Anesthesia review: no  Patient denies shortness of breath, fever, cough and chest pain at PAT appointment   All instructions explained to the patient, with a verbal understanding of the material. Patient agrees to go over the instructions while at home for a better understanding. Patient also instructed to wear a mask when out in public prior to surgery. The opportunity to ask questions was provided.

## 2022-10-05 NOTE — Anesthesia Preprocedure Evaluation (Addendum)
Anesthesia Evaluation  Patient identified by MRN, date of birth, ID band Patient awake    Reviewed: Allergy & Precautions, NPO status , Patient's Chart, lab work & pertinent test results  Airway Mallampati: II  TM Distance: >3 FB Neck ROM: Full    Dental no notable dental hx. (+) Teeth Intact, Dental Advisory Given   Pulmonary neg pulmonary ROS   Pulmonary exam normal breath sounds clear to auscultation       Cardiovascular Exercise Tolerance: Good Normal cardiovascular exam Rhythm:Regular Rate:Normal     Neuro/Psych negative neurological ROS  negative psych ROS   GI/Hepatic negative GI ROS, Neg liver ROS,,,  Endo/Other  negative endocrine ROS    Renal/GU negative Renal ROS     Musculoskeletal   Abdominal   Peds  Hematology negative hematology ROS (+) Lab Results      Component                Value               Date                      WBC                      5.1                 10/03/2022                HGB                      14.2                10/03/2022                HCT                      41.7                10/03/2022                MCV                      93.9                10/03/2022                PLT                      249                 10/03/2022              Anesthesia Other Findings   Reproductive/Obstetrics                             Anesthesia Physical Anesthesia Plan  ASA: 1  Anesthesia Plan: General   Post-op Pain Management:    Induction: Intravenous  PONV Risk Score and Plan: 4 or greater and Treatment may vary due to age or medical condition and Ondansetron  Airway Management Planned: Oral ETT  Additional Equipment: None  Intra-op Plan:   Post-operative Plan: Extubation in OR  Informed Consent: I have reviewed the patients History and Physical, chart, labs and discussed the procedure including the risks, benefits and alternatives for the  proposed anesthesia with the patient or authorized representative  who has indicated his/her understanding and acceptance.     Dental advisory given  Plan Discussed with: CRNA and Surgeon  Anesthesia Plan Comments:        Anesthesia Quick Evaluation

## 2022-10-06 ENCOUNTER — Other Ambulatory Visit: Payer: Self-pay

## 2022-10-06 ENCOUNTER — Encounter (HOSPITAL_COMMUNITY)
Admission: RE | Disposition: A | Discharge status: Home / Self Care | Payer: Self-pay | Source: Home / Self Care | Attending: Otolaryngology

## 2022-10-06 ENCOUNTER — Encounter (HOSPITAL_COMMUNITY): Payer: Self-pay | Admitting: Otolaryngology

## 2022-10-06 ENCOUNTER — Ambulatory Visit (HOSPITAL_COMMUNITY): Payer: BC Managed Care – PPO | Admitting: Anesthesiology

## 2022-10-06 ENCOUNTER — Ambulatory Visit: Payer: BC Managed Care – PPO

## 2022-10-06 ENCOUNTER — Observation Stay (HOSPITAL_COMMUNITY)
Admission: RE | Admit: 2022-10-06 | Discharge: 2022-10-07 | Disposition: A | Discharge status: Home / Self Care | Payer: BC Managed Care – PPO | Attending: Otolaryngology | Admitting: Otolaryngology

## 2022-10-06 DIAGNOSIS — E049 Nontoxic goiter, unspecified: Principal | ICD-10-CM | POA: Insufficient documentation

## 2022-10-06 DIAGNOSIS — Z01818 Encounter for other preprocedural examination: Secondary | ICD-10-CM

## 2022-10-06 HISTORY — PX: THYROID LOBECTOMY: SHX420

## 2022-10-06 LAB — POCT PREGNANCY, URINE: Preg Test, Ur: NEGATIVE

## 2022-10-06 SURGERY — LOBECTOMY, THYROID
Anesthesia: General | Laterality: Left

## 2022-10-06 MED ORDER — LACTATED RINGERS IV SOLN: INTRAVENOUS | Status: DC

## 2022-10-06 MED ORDER — ORAL CARE MOUTH RINSE: 15.0000 mL | Freq: Once | OROMUCOSAL | Status: AC

## 2022-10-06 MED ORDER — SENNOSIDES-DOCUSATE SODIUM 8.6-50 MG PO TABS: 1.0000 | ORAL_TABLET | Freq: Every evening | ORAL | Status: DC | PRN

## 2022-10-06 MED ORDER — FENTANYL CITRATE (PF) 250 MCG/5ML IJ SOLN
INTRAMUSCULAR | Status: AC
  Filled 2022-10-06: qty 5

## 2022-10-06 MED ORDER — SODIUM CHLORIDE 0.9 % IR SOLN: Status: DC | PRN

## 2022-10-06 MED ORDER — OXYCODONE HCL 5 MG PO TABS: 5.0000 mg | ORAL_TABLET | Freq: Once | ORAL | Status: DC | PRN

## 2022-10-06 MED ORDER — CEFAZOLIN SODIUM-DEXTROSE 2-4 GM/100ML-% IV SOLN
2.0000 g | INTRAVENOUS | Status: AC
  Administered 2022-10-06: 2 g via INTRAVENOUS
  Filled 2022-10-06: qty 100

## 2022-10-06 MED ORDER — EPINEPHRINE PF 1 MG/ML IJ SOLN
INTRAMUSCULAR | Status: DC | PRN
  Administered 2022-10-06: 10 mL

## 2022-10-06 MED ORDER — FENTANYL CITRATE (PF) 250 MCG/5ML IJ SOLN
INTRAMUSCULAR | Status: DC | PRN
  Administered 2022-10-06: 100 ug via INTRAVENOUS
  Administered 2022-10-06: 50 ug via INTRAVENOUS
  Administered 2022-10-06: 100 ug via INTRAVENOUS
  Administered 2022-10-06: 50 ug via INTRAVENOUS

## 2022-10-06 MED ORDER — PHENYLEPHRINE HCL-NACL 20-0.9 MG/250ML-% IV SOLN
INTRAVENOUS | Status: DC | PRN
  Administered 2022-10-06: 30 ug/min via INTRAVENOUS

## 2022-10-06 MED ORDER — DEXAMETHASONE SODIUM PHOSPHATE 10 MG/ML IJ SOLN
INTRAMUSCULAR | Status: DC | PRN
  Administered 2022-10-06: 10 mg via INTRAVENOUS

## 2022-10-06 MED ORDER — OXYCODONE HCL 5 MG/5ML PO SOLN: 5.0000 mg | Freq: Once | ORAL | Status: DC | PRN

## 2022-10-06 MED ORDER — SUCCINYLCHOLINE CHLORIDE 200 MG/10ML IV SOSY
PREFILLED_SYRINGE | INTRAVENOUS | Status: DC | PRN
  Administered 2022-10-06: 60 mg via INTRAVENOUS

## 2022-10-06 MED ORDER — ACETAMINOPHEN 650 MG RE SUPP
650.0000 mg | RECTAL | Status: DC | PRN
  Filled 2022-10-06: qty 1

## 2022-10-06 MED ORDER — PROPOFOL 500 MG/50ML IV EMUL
INTRAVENOUS | Status: DC | PRN
  Administered 2022-10-06: 30 ug/kg/min via INTRAVENOUS

## 2022-10-06 MED ORDER — ONDANSETRON HCL 4 MG/2ML IJ SOLN
INTRAMUSCULAR | Status: DC | PRN
  Administered 2022-10-06: 4 mg via INTRAVENOUS

## 2022-10-06 MED ORDER — CHLORHEXIDINE GLUCONATE 0.12 % MT SOLN: 15.0000 mL | Freq: Once | OROMUCOSAL | Status: AC

## 2022-10-06 MED ORDER — MIDAZOLAM HCL 2 MG/2ML IJ SOLN
INTRAMUSCULAR | Status: DC | PRN
  Administered 2022-10-06: 2 mg via INTRAVENOUS

## 2022-10-06 MED ORDER — HYDROCODONE-ACETAMINOPHEN 5-325 MG PO TABS
1.0000 | ORAL_TABLET | ORAL | Status: DC | PRN
  Administered 2022-10-06: 1 via ORAL
  Filled 2022-10-06 (×2): qty 1

## 2022-10-06 MED ORDER — LIDOCAINE-EPINEPHRINE 1 %-1:100000 IJ SOLN
INTRAMUSCULAR | Status: AC
  Filled 2022-10-06: qty 1

## 2022-10-06 MED ORDER — LIDOCAINE 2% (20 MG/ML) 5 ML SYRINGE
INTRAMUSCULAR | Status: DC | PRN
  Administered 2022-10-06: 40 mg via INTRAVENOUS

## 2022-10-06 MED ORDER — LIDOCAINE-EPINEPHRINE 1 %-1:100000 IJ SOLN
INTRAMUSCULAR | Status: DC | PRN
  Administered 2022-10-06: 10 mL

## 2022-10-06 MED ORDER — KETOROLAC TROMETHAMINE 30 MG/ML IJ SOLN: 30.0000 mg | Freq: Once | INTRAMUSCULAR | Status: DC | PRN

## 2022-10-06 MED ORDER — HYDROMORPHONE HCL 1 MG/ML IJ SOLN
0.2500 mg | INTRAMUSCULAR | Status: DC | PRN
  Administered 2022-10-06 (×2): 0.5 mg via INTRAVENOUS

## 2022-10-06 MED ORDER — ONDANSETRON HCL 4 MG/2ML IJ SOLN
4.0000 mg | INTRAMUSCULAR | Status: DC | PRN
  Administered 2022-10-06 (×2): 4 mg via INTRAVENOUS
  Filled 2022-10-06 (×2): qty 2

## 2022-10-06 MED ORDER — HYDROMORPHONE HCL 1 MG/ML IJ SOLN
INTRAMUSCULAR | Status: AC
  Filled 2022-10-06: qty 1

## 2022-10-06 MED ORDER — CHLORHEXIDINE GLUCONATE 0.12 % MT SOLN
15.0000 mL | Freq: Once | OROMUCOSAL | Status: AC
  Administered 2022-10-06: 15 mL via OROMUCOSAL
  Filled 2022-10-06: qty 15

## 2022-10-06 MED ORDER — ONDANSETRON HCL 4 MG PO TABS: 4.0000 mg | ORAL_TABLET | ORAL | Status: DC | PRN

## 2022-10-06 MED ORDER — PROPOFOL 10 MG/ML IV BOLUS
INTRAVENOUS | Status: DC | PRN
  Administered 2022-10-06: 200 mg via INTRAVENOUS

## 2022-10-06 MED ORDER — 0.9 % SODIUM CHLORIDE (POUR BTL) OPTIME
TOPICAL | Status: DC | PRN
  Administered 2022-10-06: 1000 mL

## 2022-10-06 MED ORDER — ONDANSETRON HCL 4 MG/2ML IJ SOLN: 4.0000 mg | Freq: Once | INTRAMUSCULAR | Status: DC | PRN

## 2022-10-06 MED ORDER — MIDAZOLAM HCL 2 MG/2ML IJ SOLN
INTRAMUSCULAR | Status: AC
  Filled 2022-10-06: qty 2

## 2022-10-06 MED ORDER — ACETAMINOPHEN 160 MG/5ML PO SOLN
650.0000 mg | ORAL | Status: DC | PRN
  Administered 2022-10-06: 650 mg via ORAL
  Filled 2022-10-06: qty 20.3

## 2022-10-06 SURGICAL SUPPLY — 55 items
ADH SKN CLS APL DERMABOND .7 (GAUZE/BANDAGES/DRESSINGS) ×2
ADH SKN CLS LQ APL DERMABOND (GAUZE/BANDAGES/DRESSINGS) ×2
BAG COUNTER SPONGE SURGICOUNT (BAG) ×3 IMPLANT
BAG SPNG CNTER NS LX DISP (BAG) ×2
BLADE SURG 15 STRL LF DISP TIS (BLADE) ×3 IMPLANT
BLADE SURG 15 STRL SS (BLADE) ×2
CANISTER SUCT 3000ML PPV (MISCELLANEOUS) ×3 IMPLANT
CNTNR URN SCR LID CUP LEK RST (MISCELLANEOUS) IMPLANT
CONT SPEC 4OZ STRL OR WHT (MISCELLANEOUS)
CORD BIPOLAR FORCEPS 12FT (ELECTRODE) ×3 IMPLANT
COVER SURGICAL LIGHT HANDLE (MISCELLANEOUS) ×3 IMPLANT
DERMABOND ADVANCED .7 DNX12 (GAUZE/BANDAGES/DRESSINGS) ×3 IMPLANT
DERMABOND ADVANCED .7 DNX6 (GAUZE/BANDAGES/DRESSINGS) ×1 IMPLANT
DRAIN JACKSON RD 7FR 3/32 (WOUND CARE) IMPLANT
DRAIN JP 10F RND RADIO (DRAIN) ×1 IMPLANT
DRAPE HALF SHEET 40X57 (DRAPES) IMPLANT
DRAPE UTILITY XL STRL (DRAPES) ×1 IMPLANT
ELECT COATED BLADE 2.86 ST (ELECTRODE) ×3 IMPLANT
ELECT REM PT RETURN 9FT ADLT (ELECTROSURGICAL) ×2
ELECTRODE REM PT RTRN 9FT ADLT (ELECTROSURGICAL) ×3 IMPLANT
EVACUATOR SILICONE 100CC (DRAIN) ×1 IMPLANT
FORCEPS BIPOLAR SPETZLER 8 1.0 (NEUROSURGERY SUPPLIES) ×3 IMPLANT
GAUZE 4X4 16PLY ~~LOC~~+RFID DBL (SPONGE) ×4 IMPLANT
GLOVE BIO SURGEON STRL SZ7.5 (GLOVE) ×3 IMPLANT
GLOVE SURG ENC MOIS LTX SZ6.5 (GLOVE) ×3 IMPLANT
GOWN STRL REUS W/ TWL LRG LVL3 (GOWN DISPOSABLE) ×6 IMPLANT
GOWN STRL REUS W/TWL LRG LVL3 (GOWN DISPOSABLE) ×4
HEMOSTAT SURGICEL 2X14 (HEMOSTASIS) ×4 IMPLANT
KIT BASIN OR (CUSTOM PROCEDURE TRAY) ×3 IMPLANT
KIT TURNOVER KIT B (KITS) ×3 IMPLANT
LOCATOR NERVE 3 VOLT (DISPOSABLE) IMPLANT
NDL HYPO 25GX1X1/2 BEV (NEEDLE) ×2 IMPLANT
NEEDLE HYPO 25GX1X1/2 BEV (NEEDLE) ×2 IMPLANT
NS IRRIG 1000ML POUR BTL (IV SOLUTION) ×3 IMPLANT
PAD ARMBOARD 7.5X6 YLW CONV (MISCELLANEOUS) ×6 IMPLANT
PATTIES SURGICAL .5 X.5 (GAUZE/BANDAGES/DRESSINGS) ×1 IMPLANT
PENCIL SMOKE EVACUATOR (MISCELLANEOUS) ×3 IMPLANT
POSITIONER HEAD DONUT 9IN (MISCELLANEOUS) IMPLANT
PROBE NERVBE PRASS .33 (MISCELLANEOUS) ×3 IMPLANT
SET WALTER ACTIVATION W/DRAPE (SET/KITS/TRAYS/PACK) IMPLANT
SHEARS HARMONIC 9CM CVD (BLADE) ×3 IMPLANT
SPONGE INTESTINAL PEANUT (DISPOSABLE) ×8 IMPLANT
STAPLER VISISTAT 35W (STAPLE) IMPLANT
STRIP CLOSURE SKIN 1/2X4 (GAUZE/BANDAGES/DRESSINGS) ×1 IMPLANT
SUT CHROMIC 4 0 PS 2 18 (SUTURE) IMPLANT
SUT MNCRL AB 4-0 PS2 18 (SUTURE) ×3 IMPLANT
SUT SILK 3 0 PS 1 (SUTURE) IMPLANT
SUT SILK 3 0 REEL (SUTURE) ×3 IMPLANT
SUT SILK 3 0SH CR/8 30 (SUTURE) ×3 IMPLANT
SUT VIC AB 3-0 SH 27 (SUTURE) ×2
SUT VIC AB 3-0 SH 27X BRD (SUTURE) ×3 IMPLANT
SUT VICRYL 4-0 PS2 18IN ABS (SUTURE) ×3 IMPLANT
TOWEL GREEN STERILE FF (TOWEL DISPOSABLE) ×3 IMPLANT
TRAY ENT MC OR (CUSTOM PROCEDURE TRAY) ×3 IMPLANT
TUBE FEEDING 10FR FLEXIFLO (MISCELLANEOUS) IMPLANT

## 2022-10-06 NOTE — Anesthesia Procedure Notes (Signed)
Procedure Name: Intubation Date/Time: 10/06/2022 9:03 AM  Performed by: Darletta Moll, CRNAPre-anesthesia Checklist: Patient identified, Emergency Drugs available, Suction available and Patient being monitored Patient Re-evaluated:Patient Re-evaluated prior to induction Oxygen Delivery Method: Circle system utilized Preoxygenation: Pre-oxygenation with 100% oxygen Induction Type: IV induction Ventilation: Mask ventilation without difficulty Laryngoscope Size: Glidescope and 3 Grade View: Grade I Tube type: Oral Tube size: 7.0 mm Number of attempts: 1 Airway Equipment and Method: Stylet and Oral airway Placement Confirmation: ETT inserted through vocal cords under direct vision, positive ETCO2 and breath sounds checked- equal and bilateral Secured at: 21 cm Tube secured with: Tape Dental Injury: Teeth and Oropharynx as per pre-operative assessment  Comments: Glidescope used to verify NIMS tube position with Dr. Hayden Rasmussen to verify

## 2022-10-06 NOTE — Transfer of Care (Signed)
Immediate Anesthesia Transfer of Care Note  Patient: Kimberly Clements  Procedure(s) Performed: LEFT HEMITHYROIDECTOMY and ISTHMUSECTOMY (Left)  Patient Location: PACU  Anesthesia Type:General  Level of Consciousness: drowsy and patient cooperative  Airway & Oxygen Therapy: Patient Spontanous Breathing  Post-op Assessment: Report given to RN, Post -op Vital signs reviewed and stable, and Patient moving all extremities X 4  Post vital signs: Reviewed and stable  Last Vitals:  Vitals Value Taken Time  BP 156/95 10/06/22 1205  Temp    Pulse 97 10/06/22 1207  Resp 16 10/06/22 1207  SpO2 98 % 10/06/22 1207  Vitals shown include unvalidated device data.  Last Pain:  Vitals:   10/06/22 0713  TempSrc:   PainSc: 0-No pain         Complications: No notable events documented.

## 2022-10-06 NOTE — H&P (Signed)
Kimberly Clements is an 52 y.o. female.    Chief Complaint:  Thyroid goiter  HPI: Patient presents today for planned elective procedure.  She denies any interval change in history since office visit on 08/08/2022:  Kimberly Clements is a 52 y.o. female who presents as a new consult, referred by [name]*, for evaluation and treatment of thyroid goiter. Patient endorses longstanding history of thyroid goiter, which has been evaluated in the past with ultrasound as well as biopsy of the isthmus nodule, former in 2019. Biopsy at that time was consistent with benign process. Patient reports progressive enlargement of the thyroid, with subsequent cosmetic deformity. She denies dysphagia, odynophagia, difficulty swallowing. She does however sometimes appreciate symptoms of fullness and mild compression. No family history of thyroid cancer, no personal history of radiation exposure. Patient is a non-smoker with no tobacco use history. She reports that her thyroid labs have been checked routinely, and have been normal.   Past Medical History:  Diagnosis Date   Medical history non-contributory     Past Surgical History:  Procedure Laterality Date   NO PAST SURGERIES      Family History  Problem Relation Age of Onset   Diabetes Mother    High blood pressure Mother    High blood pressure Father     Social History:  reports that she has never smoked. She has never used smokeless tobacco. She reports that she does not drink alcohol and does not use drugs.  Allergies: No Known Allergies  Medications Prior to Admission  Medication Sig Dispense Refill   Ascorbic Acid (VITAMIN C) 1000 MG tablet Take 1,000 mg by mouth daily.     VITAMIN D PO Take 1 tablet by mouth daily.      No results found for this or any previous visit (from the past 48 hour(s)). No results found.  ROS: ROS  Blood pressure (!) 147/90, pulse 84, temperature 97.9 F (36.6 C), temperature source Oral, resp. rate 18, height  5\' 5"  (1.651 m), weight 59 kg, last menstrual period 11/01/2021, SpO2 100 %.  PHYSICAL EXAM: Physical Exam Constitutional:      Appearance: Normal appearance.  HENT:     Right Ear: External ear normal.     Left Ear: External ear normal.     Mouth/Throat:     Mouth: Mucous membranes are moist.  Neck:     Comments: Thyroid goiter Pulmonary:     Effort: Pulmonary effort is normal.  Neurological:     General: No focal deficit present.     Mental Status: She is alert.  Psychiatric:        Mood and Affect: Mood normal.        Behavior: Behavior normal.     Studies Reviewed: Thyroid US reviewed   Assessment/Plan Kimberly Clements is a 52 y.o. female with history of multinodular thyroid goiter causing mild compressive symptoms as well as cosmetic deformity. Patient underwent biopsy of enlarging thyroid isthmus nodule in 2020, which was consistent with benign pathology.  -To OR today for left hemithyroidectomy and isthmusectomy, possible total thyroidectomy. Risks of surgery, recovery were reviewed. All questions answered.    Kimberly Clements A Kimberly Clements 10/06/2022, 7:31 AM

## 2022-10-06 NOTE — Discharge Instructions (Signed)
[facility] Post Operative Instructions Office Number [phone]  The Surgery Itself Thyroid surgery involves general anesthesia. Patients may be quite sedated for several hours after surgery and may remain sleepy for much of the day. Nausea and vomiting is occasionally seen, and usually resolves by the evening of surgery - even without additional medications. Some patients stay one night in the hospital and are discharged the next day. Other patients can go home the evening of surgery. Many patients will have a drain in place after surgery-this is removed the following day before you go home from the hospital or in the office 1-3 days after surgery.   Your Incision Your incision is closed with absorbable sutures and is covered with a small strip of tape or skin glue. You can shower and wash your hair as usual starting 24-48 hours after your drain is removed, as directed by your surgeon. If you did not have a drain in place after surgery, you may shower 24 hours after surgery. You may wash in a bathtub prior to that time if you are careful not to get your neck wet. Do not soak or scrub the incision. You might notice bruising around your incision or upper chest and slight swelling above the scar when you are upright. In addition, the scar may become pink and hard. This hardening will peak at about 3 weeks and may result in some tightness or difficulty swallowing, which will disappear over the next 2 to 3 months. You should apply sunscreen on your incision once directed by your surgeon (usually starting one month  after surgery) EVERY day for the first year after surgery. This will prevent a red or pink scar and give you the best cosmetic result for your scar. A daily moisturizer with sunscreen (example Oil of Olay with SPF 15) is fine.   Limitations You can start resuming normal activities as tolerated 7 days after surgery. For some patients, lifting can cause pain and stretching  at the surgery site for up to 2-3 weeks after surgery. You should not drive or drink alcohol while taking pain medications. Most people can return to work/school in 1-2 weeks after surgery, but there may be physical limitations as far as what you may do while at work.   Medications ? Pain medication should be used for pain as prescribed. Pain is expected after surgery. Your neck will be sore and pain will be worse when the neck is stretched and when you swallow. As the surgical site heals, pain will resolve over the course of a week. It is not uncommon for pain to get worse when you first go home because your activity may increase, but from that point on the pain should improve every day. Pain medications can cause nausea, which can be prevented if you take them with food or milk. ? You may be given a stool softener (Colace) because pain medications may make you  constipated. You can also use an over the counter stool softener.  ? You may be given Bacitracin ointment. If you are, it should be applied to the drain exit site three times a day for 2 days after the drain is removed. It should also be applied to the drain site before and after your first shower after surgery. It is normal to have some red or pink drainage from your drain exit site for 1-2 days after it is removed. ? If you were taking thyroid hormone tablets before your operation, you will continue   this after surgery, but sometimes your surgeon will change the dose. If you were not taking thyroid hormone prior to your operation, your surgeon may prescribe these tablets following surgery if the entire thyroid is removed. During your post-operative visits, you may have a blood test to measure your levels of thyroid hormone and your dose of medication may be adjusted accordingly. ? If you had parathyroid surgery or a total thyroidectomy, you may be instructed to take extra calcium supplements until your blood calcium levels stabilize. These usually  have to be purchased "over the counter" at a drug store and your surgeon will give you specific instructions. Generic brands are fine. Calcium carbonate with Vitamin D or TUMS are usually recommended. If you take any medications for gastric reflux (heartburn), you may be instructed to take Calcium Citrate with vitamin D instead of the other types of calcium. Your surgeon will instruct you on which type of calcium supplement to purchase and how many tablets to take each day after surgery. ? Take all of your routine medications as prescribed, unless told otherwise by your surgeon. Any medications which thin the blood should be avoided until your surgeon tells you to resume them.  ? IT IS OK TO TAKE OVER THE COUNTER PAIN MEDICATION (IBUPROFEN, NAPROXEN, or ACETAMINOPHEN) IN ADDITION TO YOUR PRESCRIBED MEDICATIONS. DO NOT TAKE ASPIRIN UNLESS CLEARED WITH YOUR SURGEON.  ? Limit Acetaminophen/Tylenol to less than 4,000mg/day  ? Limit Ibuprofen/Motrin to less than 3,600mg/day  Pain The main complaint following thyroid surgery is pain with swallowing and neck movement. Some people experience a dull ache, while others feel a sharp pain. This should not keep you from eating anything you want or moving your neck and will improve daily after surgery. It is normal to have a sore throat as well.   Voice Your voice may go through some temporary changes with fluctuations in volume and clarity (hoarseness). Generally, it will be better in the mornings and "tire" toward the end of the day. This can last for variable periods of time, but should clear in 8-10 weeks at most.   Cough You may feel like you have phlegm in your throat or a sore throat. This is usually because there was a tube in your windpipe while you were asleep that caused irritation that you perceive as phlegm. You will notice that if you cough, very little phlegm will come up. This should clear up in 4 to 5 days.  Hypocalcemia (Low blood calcium) In  some patients who have thyroid and parathyroid surgery, the parathyroid glands do not function properly immediately after surgery. This is usually temporary and causes the blood calcium level to drop below normal (hypocalcemia). Symptoms of hypocalcemia include numbness and tingling of your lips (like they fell asleep), in your hands and in your feet. Some patients experience a "crawling" sensation in the skin, muscle cramps or headaches. These symptoms can appear between 24 and 72 hours after surgery. It is rare for them to start more than 72 hours after surgery. If this happens, take 2 extra calcium tablets. If the symptoms do not resolve within one hour, you should call your doctor. If this happens during the business day, call the nurse triage line. If this happens in the evening or over the weekend, call the on call physician or go to the ER so your blood calcium levels can be checked. You can take extra calcium supplements (which will help to resolve symptoms) as you are contacting the clinic or   coming to the ER. Taking extra calcium supplements if you do not need them will not cause you any harm.  Reasons to call your surgeon's office ? Persistent fever over 101 F ? Increasing neck swelling ? Numbness or tingling around your mouth or fingertips  ? Pain that is not relieved by your medications ? Purulent drainage (pus) from the incision ? Redness surrounding the incision that is worsening or getting bigger ? Bleeding is possible after surgery and the most serious cases may cause trouble breathing. Symptoms include rapid swelling in the neck, trouble breathing, red and purple discoloration of the skin over the incision. If breathing difficulty occurs with this rapid neck swelling, call the doctor immediately, go to the closest emergency room or call 911.  

## 2022-10-06 NOTE — Op Note (Signed)
OPERATIVE NOTE  Kimberly Clements Date/Time of Admission: 10/06/2022  6:44 AM  CSN: [id number] Attending Provider: Ebbie Latus A, DO Room/Bed: MCPO/NONE DOB: 05-Jul-1971 Age: 52 y.o.   Pre-Op Diagnosis: Thyroid Goiter  Post-Op Diagnosis: Thyroid Goiter  Procedure: Procedure(s): LEFT HEMITHYROIDECTOMY and ISTHMUSECTOMY  Anesthesia: General  Surgeon(s): [place], DO  Staff: Circulator: Everhart, Donnie Aho, RN Scrub Person: Lindwood Coke, RN; Rolan Bucco RN First Assistant: Celene Squibb, RN  Implants: * No implants in log *  Specimens: ID Type Source Tests Collected by Time Destination  1 : LEFT THYROID GLAND AND ISTHMUS Tissue PATH ENT excision SURGICAL PATHOLOGY Marshall Roehrich A, DO 05/07/2354 [id number]     Complications: None  EBL: 50 ML  Condition: stable  Operative Findings:  2 of  the 2 left parathyroid gland were identified and were nerurovascularly intact. The left recurrent laryngeal nerve was intact to visual examination and electrical stimulation at 0.4  mA on the left.  Description of Operation: The patient was identified in the preoperative holding area.  Informed written consent including risks, benefits, alternatives, and possible complications including bleeding and nerve injury were obtained.  Patient was then taken, under the care of anesthesia, to the operating suite.  Patient was placed in the supine position on the operating table and then general anesthesia was administered using a NIMS endotracheal tube per anesthesia's protocol.  Incision site on anterior neck was marked and infiltrated with 10cc Lidocaine 1% with 1:100,000 epinephrine.  A transverse incision, approximately 5cm in length was made within a skin crease with a 15 blade scalpel. The incision was carried down through the platysma. Inferiorly and superiorly based subplatysmal flaps were raised to the level of the clavicle and thyroid cartilage, respectively.   Four 2-0 silk sutures were used to retract the skin flaps at each corner of the incision. The midline raphe of the strap muscles was then identified and separated with the Bovie electrocautery in anatomic midline. The strap muscles were then dissected from the left  thyroid capsule. The strap muscles were retracted laterally. The superior pole vasculature was then identified and surrounding tissue was dissected free using both sharp and blunt instrumentation.  The superior pole vasculature was then clamped, cut, and ligated using Harmonic Scalpel.  The inferior pole was then identified and surrounding tissue were dissected free using both sharp and blunt instrumentation.  The inferior pole vessels were then clamped, cut, and ligated using Harmonic Scalpel. The thyroid was then retracted medially.  The middle thyroid vein was identified and ligated.  The recurrent laryngeal was then identified in the tracheoesophageal groove.  The nerve integrity monitoring probe was then used to stimulate the recurrent laryngeal nerve to ensure its identification and integrity.  The nerve was carefully dissected superiorly until it entered the larynx.  The left   thyroid and large isthmus nodule was removed from the anterior tracheal wall through Berry's Ligament. The thyroid lobe was then passed off the field to be evaluated by pathology as a permanent specimen.  Spot bipolar electrocautery was used to achieve meticulous hemostasis, and the dissected nerve was once again stimulated and found to be functional.  The wound bed was copiously irrigated and once again inspected for any bleeding.  There was none.  Surgicel was placed in the wound bed.  A 10 french JP drain was passed through a separate stab incision inferior to the surgical site and sutured in place using 2-0 silk suture. The strap muscles were then reapproximated  using a 3-0 chromic suture in a running, locking fashion. The incision was then closed in a layered fashion.  The platysma was closed with interrupted 3-0 Vicryl sutures.  The subcutaneous tissue was then closed with buried interrupted 4-0 vicryl sutures.  The skin was then closed with 4-0 Monocryl and Dermabond.  Finally, Steristrips were placed over the incision. All counts were correct and the procedure was terminated.   Jason Coop, [facility]  10/06/2022

## 2022-10-06 NOTE — Progress Notes (Signed)
Received patient from PACU via bed. Patient is alert and oriented x 4.  Ambulated to the bathroom independently.  Voided freely.  Assisted in bed in position of comfort, oriented to room and unit routine.  Anterior neck incision with steri strips, clean, dry and intact.  JP drain charged, noted with minimal bloody drainage.  Needs addressed, family at bedside.

## 2022-10-06 NOTE — Anesthesia Postprocedure Evaluation (Signed)
Anesthesia Post Note  Patient: Kimberly Clements  Procedure(s) Performed: LEFT HEMITHYROIDECTOMY and ISTHMUSECTOMY (Left)     Patient location during evaluation: PACU Anesthesia Type: General Level of consciousness: awake and alert Pain management: pain level controlled Vital Signs Assessment: post-procedure vital signs reviewed and stable Respiratory status: spontaneous breathing, nonlabored ventilation, respiratory function stable and patient connected to nasal cannula oxygen Cardiovascular status: blood pressure returned to baseline and stable Postop Assessment: no apparent nausea or vomiting Anesthetic complications: no  No notable events documented.  Last Vitals:  Vitals:   10/06/22 1425 10/06/22 1437  BP: (!) 157/105 (!) 140/99  Pulse: (!) 103 93  Resp: 20 18  Temp: 37.1 C   SpO2: 100% 100%    Last Pain:  Vitals:   10/06/22 1432  TempSrc:   PainSc: 4                  Barnet Glasgow

## 2022-10-06 NOTE — Plan of Care (Signed)
  Problem: Education: Goal: Knowledge of the prescribed therapeutic regimen will improve Outcome: Progressing   Problem: Activity: Goal: Ability to tolerate increased activity will improve Outcome: Progressing   Problem: Health Behavior/Discharge Planning: Goal: Identification of resources available to assist in meeting health care needs will improve Outcome: Progressing   Problem: Nutrition: Goal: Maintenance of adequate nutrition will improve Outcome: Progressing   Problem: Clinical Measurements: Goal: Complications related to the disease process, condition or treatment will be avoided or minimized Outcome: Progressing   Problem: Respiratory: Goal: Will regain and/or maintain adequate ventilation Outcome: Progressing   Problem: Skin Integrity: Goal: Demonstration of wound healing without infection will improve Outcome: Progressing   Problem: Education: Goal: Knowledge of General Education information will improve Description Including pain rating scale, medication(s)/side effects and non-pharmacologic comfort measures Outcome: Progressing   Problem: Health Behavior/Discharge Planning: Goal: Ability to manage health-related needs will improve Outcome: Progressing   Problem: Clinical Measurements: Goal: Ability to maintain clinical measurements within normal limits will improve Outcome: Progressing Goal: Will remain free from infection Outcome: Progressing Goal: Diagnostic test results will improve Outcome: Progressing Goal: Respiratory complications will improve Outcome: Progressing Goal: Cardiovascular complication will be avoided Outcome: Progressing   Problem: Activity: Goal: Risk for activity intolerance will decrease Outcome: Progressing   Problem: Nutrition: Goal: Adequate nutrition will be maintained Outcome: Progressing   Problem: Coping: Goal: Level of anxiety will decrease Outcome: Progressing   Problem: Elimination: Goal: Will not experience  complications related to bowel motility Outcome: Progressing Goal: Will not experience complications related to urinary retention Outcome: Progressing   Problem: Pain Managment: Goal: General experience of comfort will improve Outcome: Progressing   Problem: Safety: Goal: Ability to remain free from injury will improve Outcome: Progressing   Problem: Skin Integrity: Goal: Risk for impaired skin integrity will decrease Outcome: Progressing   

## 2022-10-06 NOTE — TOC CM/SW Note (Signed)
  Transition of Care [facility]) Screening Note   Patient Details  Name: Kimberly Clements Date of Birth: 09/07/1970    Transition of Care Department [facility]) has reviewed patient and no TOC needs have been identified at this time. We will continue to monitor patient advancement through interdisciplinary progression rounds. If new patient transition needs arise, please place a TOC consult.

## 2022-10-07 ENCOUNTER — Encounter (HOSPITAL_COMMUNITY): Payer: Self-pay | Admitting: Otolaryngology

## 2022-10-07 DIAGNOSIS — E049 Nontoxic goiter, unspecified: Secondary | ICD-10-CM | POA: Diagnosis not present

## 2022-10-07 MED ORDER — HYDROCODONE-ACETAMINOPHEN 5-325 MG PO TABS: 1.0000 | ORAL_TABLET | Freq: Four times a day (QID) | ORAL | 0 refills | Status: AC | PRN

## 2022-10-07 MED ORDER — ONDANSETRON HCL 4 MG PO TABS: 4.0000 mg | ORAL_TABLET | Freq: Three times a day (TID) | ORAL | 0 refills | Status: AC | PRN

## 2022-10-07 NOTE — Discharge Summary (Signed)
Physician Discharge Summary  Patient ID: Kimberly Clements MRN: [id number] DOB/AGE: May 28, 1971 51 y.o.  Admit date: 10/06/2022 Discharge date: 10/07/2022  Admission Diagnoses:  Principal Problem:   Thyroid goiter   Discharge Diagnoses:  Same  Surgeries: Procedure(s): LEFT HEMITHYROIDECTOMY and ISTHMUSECTOMY on 10/06/2022   Consultants: None  Discharged Condition: Improved  Hospital Course: Kimberly Clements is an 52 y.o. female who was admitted 10/06/2022 with a chief complaint of  Thyroid goiter.  They were brought to the operating room on 10/06/2022 and underwent the above named procedures. Patient was admitted for routine overnight observation. On POD #1, JP drain was removed. Patient noted pain was controlled. She was tolerating clears. Postop instructions were again reviewed.   Physical Exam:  General: Awake and alert, no acute distress Neck: Midline neck incision C/D/I. No evidence of seroma or hematoma. JP drain with <5cc SS drainage Respiratory: Respiratory effort is normal.   Recent vital signs:  Vitals:   10/07/22 0616 10/07/22 0800  BP: 132/77 121/82  Pulse: 98 96  Resp: 18 18  Temp: 98.5 F (36.9 C) 98.3 F (36.8 C)  SpO2: 99% 100%    Recent laboratory studies:  Results for orders placed or performed during the hospital encounter of 10/06/22  Pregnancy, urine POC  Result Value Ref Range   Preg Test, Ur NEGATIVE NEGATIVE    Discharge Medications:   Allergies as of 10/07/2022   No Known Allergies      Medication List     TAKE these medications    HYDROcodone-acetaminophen 5-325 MG tablet Commonly known as: NORCO/VICODIN Take 1 tablet by mouth every 6 (six) hours as needed for up to 5 days for moderate pain.   ondansetron 4 MG tablet Commonly known as: Zofran Take 1 tablet (4 mg total) by mouth every 8 (eight) hours as needed for up to 4 days for nausea or vomiting.   vitamin C 1000 MG tablet Take 1,000 mg by mouth daily.   VITAMIN D PO Take 1 tablet by  mouth daily.        Diagnostic Studies: No results found.  Disposition: Discharge disposition: 01-Home or Self Care            Signed: Oprah Camarena A Mercedies Ganesh 10/07/2022, 10:29 AM

## 2022-10-09 LAB — SURGICAL PATHOLOGY

## 2022-11-09 ENCOUNTER — Ambulatory Visit
Admission: RE | Admit: 2022-11-09 | Discharge: 2022-11-09 | Disposition: A | Discharge status: Home / Self Care | Payer: BC Managed Care – PPO | Source: Ambulatory Visit | Attending: Family Medicine | Admitting: Family Medicine

## 2022-11-09 DIAGNOSIS — Z1231 Encounter for screening mammogram for malignant neoplasm of breast: Secondary | ICD-10-CM

## 2022-11-15 ENCOUNTER — Other Ambulatory Visit: Payer: Self-pay | Admitting: Family Medicine

## 2022-11-15 DIAGNOSIS — R928 Other abnormal and inconclusive findings on diagnostic imaging of breast: Secondary | ICD-10-CM

## 2022-12-05 ENCOUNTER — Ambulatory Visit
Admission: RE | Admit: 2022-12-05 | Discharge: 2022-12-05 | Disposition: A | Discharge status: Home / Self Care | Payer: BC Managed Care – PPO | Source: Ambulatory Visit | Attending: Family Medicine | Admitting: Family Medicine

## 2022-12-05 ENCOUNTER — Ambulatory Visit: Payer: BC Managed Care – PPO

## 2022-12-05 DIAGNOSIS — R928 Other abnormal and inconclusive findings on diagnostic imaging of breast: Secondary | ICD-10-CM

## 2023-12-17 ENCOUNTER — Other Ambulatory Visit: Payer: Self-pay | Admitting: Family Medicine

## 2023-12-17 DIAGNOSIS — Z1231 Encounter for screening mammogram for malignant neoplasm of breast: Secondary | ICD-10-CM

## 2024-01-03 ENCOUNTER — Emergency Department (HOSPITAL_COMMUNITY): Payer: Worker's Compensation

## 2024-01-03 ENCOUNTER — Emergency Department (HOSPITAL_COMMUNITY)
Admission: EM | Admit: 2024-01-03 | Discharge: 2024-01-03 | Disposition: A | Discharge status: Home / Self Care | Payer: Worker's Compensation | Attending: Emergency Medicine | Admitting: Emergency Medicine

## 2024-01-03 ENCOUNTER — Other Ambulatory Visit: Payer: Self-pay

## 2024-01-03 DIAGNOSIS — M79644 Pain in right finger(s): Secondary | ICD-10-CM | POA: Diagnosis not present

## 2024-01-03 DIAGNOSIS — Y990 Civilian activity done for income or pay: Secondary | ICD-10-CM | POA: Diagnosis not present

## 2024-01-03 DIAGNOSIS — M533 Sacrococcygeal disorders, not elsewhere classified: Secondary | ICD-10-CM | POA: Diagnosis not present

## 2024-01-03 DIAGNOSIS — W19XXXA Unspecified fall, initial encounter: Secondary | ICD-10-CM

## 2024-01-03 DIAGNOSIS — M549 Dorsalgia, unspecified: Secondary | ICD-10-CM | POA: Diagnosis not present

## 2024-01-03 DIAGNOSIS — R519 Headache, unspecified: Secondary | ICD-10-CM | POA: Diagnosis present

## 2024-01-03 DIAGNOSIS — W01198A Fall on same level from slipping, tripping and stumbling with subsequent striking against other object, initial encounter: Secondary | ICD-10-CM | POA: Insufficient documentation

## 2024-01-03 MED ORDER — IBUPROFEN 200 MG PO TABS
600.0000 mg | ORAL_TABLET | Freq: Once | ORAL | Status: AC
  Administered 2024-01-03: 600 mg via ORAL
  Filled 2024-01-03: qty 3

## 2024-01-03 NOTE — Discharge Instructions (Signed)
 Your workup is reassuring today.  The x-ray of your right pinky finger does not show any signs of a fracture or dislocation.  X-ray of your lower spine and tailbone do not show any signs of a fracture or dislocation.  The CT of your head was normal, no brain bleed or skull fracture.  You may take up to 1000mg  of tylenol  every 6 hours as needed for pain.  Do not take more then 4g per day.  You may use up to 600mg  ibuprofen  every 6 hours as needed for pain.  Do not exceed 2.4g of ibuprofen  per day.  Please follow-up with your PCP if symptoms not improving within the next week.  Return to the ER for any severe headache not controlled with Tylenol  and ibuprofen , changes in vision, persistent vomiting, any other new or concerning symptoms.

## 2024-01-03 NOTE — ED Provider Notes (Signed)
 [place] [facility] AT [facility] Provider Note   CSN: [id number] Arrival date & time: 01/03/24  1145     History  Chief Complaint  Patient presents with   Fall    Pt arrives POV s/p fall out of chair, hit head and tailbone, c/o tailbone pain. No thinners, no LOC.     Kimberly Clements is a 53 y.o. female with no significant past medical history presents with concern for a fall just prior to arrival.  States she was going to sit down, and she realized there is no chair behind her.  This caused her to fall back hitting the back of her head and tailbone.  Reporting pain in these areas currently.  Denies any loss of consciousness.  She is not on any anticoagulation.  Reports the tingling sensation in the left buttock, denies any paresthesias elsewhere.   Fall Associated symptoms include headaches.       Home Medications Prior to Admission medications   Medication Sig Start Date End Date Taking? Authorizing Provider  Ascorbic Acid (VITAMIN C) 1000 MG tablet Take 1,000 mg by mouth daily.    [provider]  VITAMIN D PO Take 1 tablet by mouth daily.    [provider]      Allergies    Patient has no known allergies.    Review of Systems   Review of Systems  Neurological:  Positive for headaches.    Physical Exam Updated Vital Signs BP 131/86   Pulse 89   Temp 98.7 F (37.1 C) (Oral)   Resp 18   SpO2 100%  Physical Exam Vitals and nursing note reviewed.  Constitutional:      Appearance: Normal appearance.  HENT:     Head: Normocephalic and atraumatic.     Comments: No hematoma, lacerations, or abrasions No skull tenderness to palpation diffusely, no step offs noted Eyes:     Extraocular Movements: Extraocular movements intact.     Pupils: Pupils are equal, round, and reactive to light.  Cardiovascular:     Rate and Rhythm: Normal rate and regular rhythm.  Pulmonary:     Effort: Pulmonary effort is normal.     Breath sounds:  Normal breath sounds.  Musculoskeletal:     Comments: General No obvious deformity. No erythema, edema, contusions, open wounds   Palpation Tenderness palpation over the middle phalanx of the right little finger.  Non-tender to palpation of the clavicles,humerus, radius and ulna, carpal bones, 1st-5th metacarpals bilaterally. No tenderness to palpation of the 1st-5th phalanges on the left hand. No tenderness to palpation of the 1st-4th phalanges of the right hand. No snuffbox tenderness palpation bilaterally Non tender over the femur, patella, tibia or fibula bilaterally  Non-tender over the pelvis  Non-tender over the cervical, thoracic, or lumbar spinous processes. Non-tender to palpation of the paraspinal region of the back or chest wall diffusely  ROM Able to fully flex and extend the 1st through 5th MCPs, PIPs, DIPs of the right and left hand.  Able to flex and extend wrist bilaterally Full ROM of shoulders bilaterally Full elbow, wrist, knee flexion and extension bilaterally Intact plantarflexion and dorsiflexion, hip flexion bilaterally  Walks without difficulty     Neurological:     General: No focal deficit present.     Mental Status: She is alert.     Comments: Sensation: Sensation intact throughout the bilateral upper and lower extremity  Strength: 5/5 strength with resisted elbow and wrist flexion and  extension bilaterally 5/5 strength with resisted knee flexion and extension and ankle plantarflexion and dorsiflexion bilaterally   Psychiatric:        Mood and Affect: Mood normal.        Behavior: Behavior normal.     ED Results / Procedures / Treatments   Labs (all labs ordered are listed, but only abnormal results are displayed) Labs Reviewed - No data to display  EKG None  Radiology DG Sacrum/Coccyx Result Date: 01/03/2024 CLINICAL DATA:  Pain after fall EXAM: SACRUM AND COCCYX - 3 VIEW COMPARISON:  None Available. FINDINGS: Minimal sclerosis along the  left sacroiliac joint. No fracture or dislocation. Preserved bone mineralization. Note is made of diffuse bowel gas. Recommend continue precautions until clinical clearance and if there is further concern of injury additional workup with CT or MRI as clinically appropriate. IMPRESSION: Mild degenerative changes. Electronically Signed   By: Adrianna Horde M.D.   On: 01/03/2024 13:48   DG Lumbar Spine Complete Result Date: 01/03/2024 CLINICAL DATA:  Pain after fall EXAM: LUMBAR SPINE - COMPLETE 5 VIEW COMPARISON:  None Available. FINDINGS: Five lumbar-type vertebral bodies. Preserved vertebral body height. Levoconvex curvature of the spine centered at the L4 level. There is moderate disc height loss at this level with tiny osteophytes. Trace anterolisthesis of L4 on L5 and retrolisthesis of L5 on S1. Mild lower lumbar facet degenerative changes seen particularly at L4-5 and L5-S1. No fracture or dislocation. Recommend continue precautions until clinical clearance and if there is further concern of injury additional workup with CT recommended for further sensitivity. Note is made of diffuse colonic and small bowel distension with air. 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 Trace listhesis at these levels as well. Electronically Signed   By: Adrianna Horde M.D.   On: 01/03/2024 13:46   DG Finger Little Right Result Date: 01/03/2024 CLINICAL DATA:  Pain after fall EXAM: RIGHT LITTLE FINGER 3V COMPARISON:  None Available. FINDINGS: There is no evidence of fracture or dislocation. There is no evidence of arthropathy or other focal bone abnormality. Soft tissues are unremarkable. IMPRESSION: No acute osseous abnormality. Electronically Signed   By: Adrianna Horde M.D.   On: 01/03/2024 13:44   CT Head Wo Contrast Result Date: 01/03/2024 CLINICAL DATA:  Head trauma, fall earlier today. EXAM: CT HEAD WITHOUT CONTRAST TECHNIQUE: Contiguous axial images were  obtained from the base of the skull through the vertex without intravenous contrast. RADIATION DOSE REDUCTION: This exam was performed according to the departmental dose-optimization program which includes automated exposure control, adjustment of the mA and/or kV according to patient size and/or use of iterative reconstruction technique. COMPARISON:  MRI head 12/11/2021. FINDINGS: Brain: No acute intracranial hemorrhage. No CT evidence of acute infarct. No edema, mass effect, or midline shift. The basilar cisterns are patent. Ventricles: The ventricles are normal. Vascular: No hyperdense vessel or unexpected calcification. Skull: No acute or aggressive finding. Orbits: Orbits are symmetric. Sinuses: The visualized paranasal sinuses are clear. Other: Mastoid air cells are clear. IMPRESSION: No CT evidence of acute intracranial abnormality. Electronically Signed   By: Denny Flack M.D.   On: 01/03/2024 13:28    Procedures Procedures    Medications Ordered in ED Medications  ibuprofen  (ADVIL ) tablet 600 mg (600 mg Oral Given 01/03/24 1218)    ED Course/ Medical Decision Making/ A&P  Medical Decision Making Amount and/or Complexity of Data Reviewed Radiology: ordered.  Risk OTC drugs.     Differential diagnosis includes but is not limited to fracture, dislocation, intracranial hemorrhage, herniated nucleus pulposus, concussion  ED Course:  Upon initial evaluation, patient is well-appearing, stable vital signs.  No signs of obvious head trauma such as hematoma or laceration. No skull TTP. No focal neuro deficits. Non tender of the cervical, thoracic, or lumbar spine.  Neurovascularly intact in the bilateral upper and lower extremities.  Tender over the middle phalanx of the right little finger, but with full ROM of the MCP, PIP, DIP of the right little finger.  No concern for tendon injury of the right little finger.    Imaging Studies ordered: I ordered  imaging studies including x-ray right little finger, x-ray lumbar spine and coccyx, CT head I independently visualized the imaging with scope of interpretation limited to determining acute life threatening conditions related to emergency care.  X-ray of the right little finger without sign of fracture or dislocation. X-ray of the lumbar spine and coccyx without fracture or dislocation. CT head without acute abnormality I agree with the radiologist interpretation   Medications Given: Ibuprofen  for pain  Upon re-evaluation, patient reports pain better controlled with ibuprofen  and she is able to move around more comfortably.  Remains with stable vitals.  Discussed the x-ray and CT results with her, no fractures or dislocations. No brain bleed. 5/5 strength of the bilateral upper and lower extremities, intact sensation of the bilateral upper and lower extremities, low concern for nerve or spinal injury at this time.  No concern for any emergent condition at this time.  Suspect she has a bone contusion from her fall.  Stable and appropriate for discharge home    Impression: Mechanical fall  Disposition:  The patient was discharged home with instructions to take Tylenol  and ibuprofen  as needed for pain.  Follow-up with her PCP if symptoms not improving within the next week. Return precautions given.    This chart was dictated using voice recognition software, Dragon. Despite the best efforts of this provider to proofread and correct errors, errors may still occur which can change documentation meaning.          Final Clinical Impression(s) / ED Diagnoses Final diagnoses:  Fall, initial encounter    Rx / DC Orders ED Discharge Orders     None         Rexie Catena, Kirby Peoples 01/03/24 1401    Dorenda Gandy, MD 01/03/24 [phone]

## 2024-02-25 ENCOUNTER — Ambulatory Visit
Admission: RE | Admit: 2024-02-25 | Discharge: 2024-02-25 | Disposition: A | Discharge status: Home / Self Care | Payer: Self-pay | Source: Ambulatory Visit | Attending: Family Medicine | Admitting: Family Medicine

## 2024-02-25 DIAGNOSIS — Z1231 Encounter for screening mammogram for malignant neoplasm of breast: Secondary | ICD-10-CM

## 2024-07-25 ENCOUNTER — Other Ambulatory Visit (HOSPITAL_BASED_OUTPATIENT_CLINIC_OR_DEPARTMENT_OTHER): Payer: Self-pay | Admitting: Family Medicine

## 2024-07-25 DIAGNOSIS — E7800 Pure hypercholesterolemia, unspecified: Secondary | ICD-10-CM

## 2024-08-13 ENCOUNTER — Ambulatory Visit (HOSPITAL_BASED_OUTPATIENT_CLINIC_OR_DEPARTMENT_OTHER)
Admission: RE | Admit: 2024-08-13 | Discharge: 2024-08-13 | Disposition: A | Discharge status: Home / Self Care | Payer: Self-pay | Source: Ambulatory Visit | Attending: Family Medicine | Admitting: Family Medicine

## 2024-08-13 DIAGNOSIS — E7800 Pure hypercholesterolemia, unspecified: Secondary | ICD-10-CM | POA: Insufficient documentation
